# Patient Record
Sex: Male | Born: 1985 | Race: White | Hispanic: No | Marital: Married | Smoking: Never smoker
Health system: Southern US, Community
[De-identification: ages and names within clinical notes are randomized; demographics above are authoritative.]

## PROBLEM LIST (undated history)

## (undated) DIAGNOSIS — K5792 Diverticulitis of intestine, part unspecified, without perforation or abscess without bleeding: Secondary | ICD-10-CM

## (undated) DIAGNOSIS — T7840XA Allergy, unspecified, initial encounter: Secondary | ICD-10-CM

## (undated) HISTORY — DX: Allergy, unspecified, initial encounter: T78.40XA

## (undated) HISTORY — PX: HERNIA REPAIR: SHX51

## (undated) HISTORY — PX: SHOULDER FUSION SURGERY: SHX775

---
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
## 2009-10-03 ENCOUNTER — Ambulatory Visit (HOSPITAL_COMMUNITY): Admission: RE | Admit: 2009-10-03 | Discharge: 2009-10-03 | Payer: Self-pay | Admitting: Family Medicine

## 2010-12-02 IMAGING — CT CT HEAD WO/W CM
2 of 3 series · 17 of 30 positions shown, 20 images · IV contrast (agent unspecified)
Comparison: None.

CLINICAL DATA: Sudden onset right parietal headache 1 week ago with
nausea and vomiting.

CT HEAD WITHOUT AND WITH CONTRAST
TECHNIQUE: Contiguous axial images were obtained from the base of
the skull through the vertex without and with intravenous contrast.
Contrast: 100 ml Nmnipaque-ETT

[Series 2: brain · axial · 0.47mm/px · z∈[+195,+318]mm · 9 of 32 slices shown, 12 images]
[im 4/32  brain]
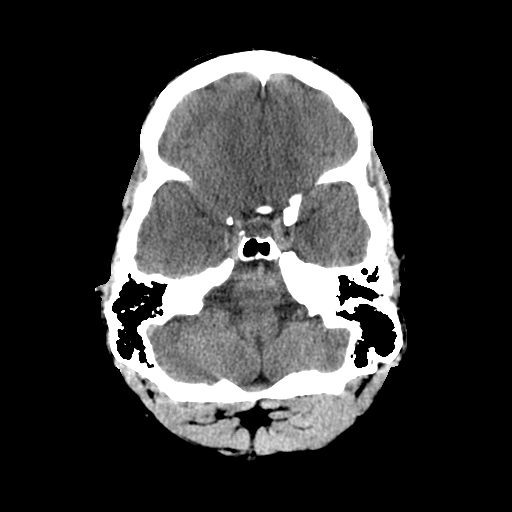
[im 4/32  bone]
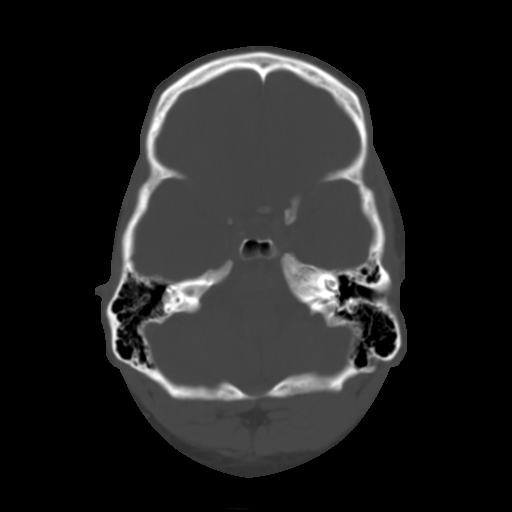
[im 7/32  brain]
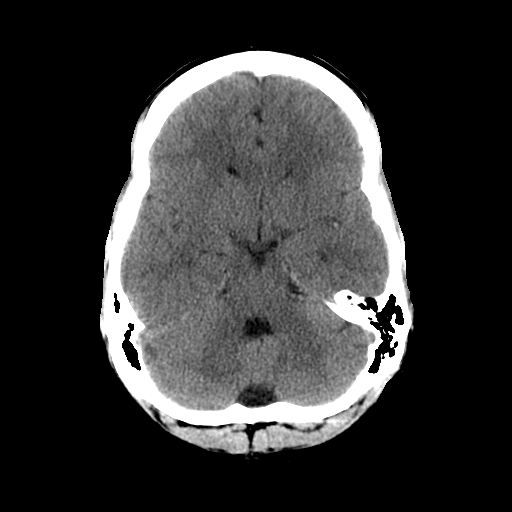
[im 10/32  brain]
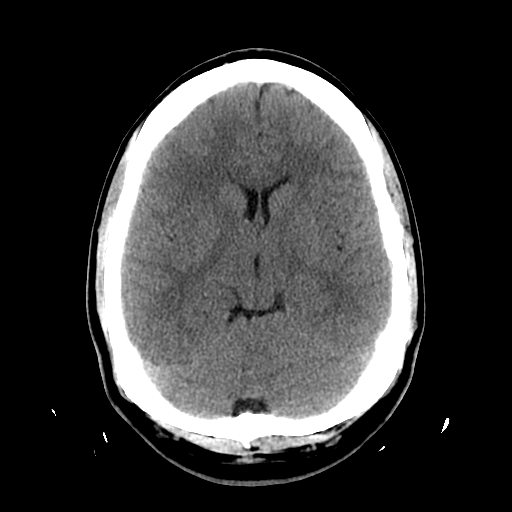
[im 13/32  brain]
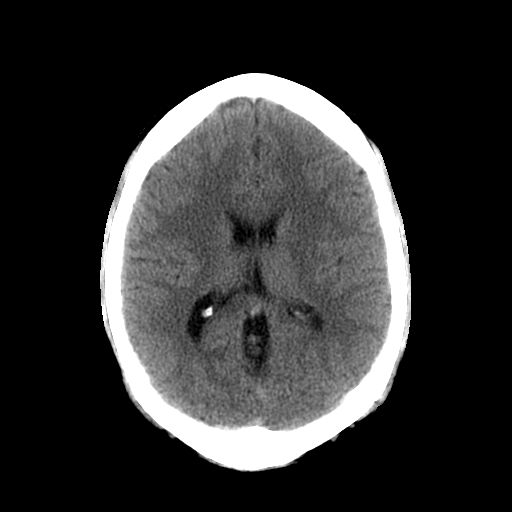
[im 16/32  brain]
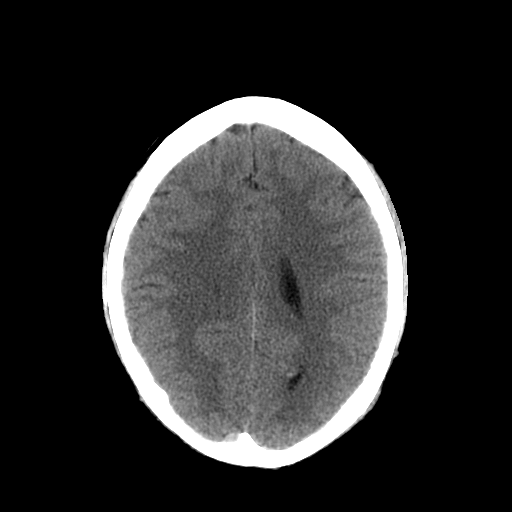
[im 16/32  bone]
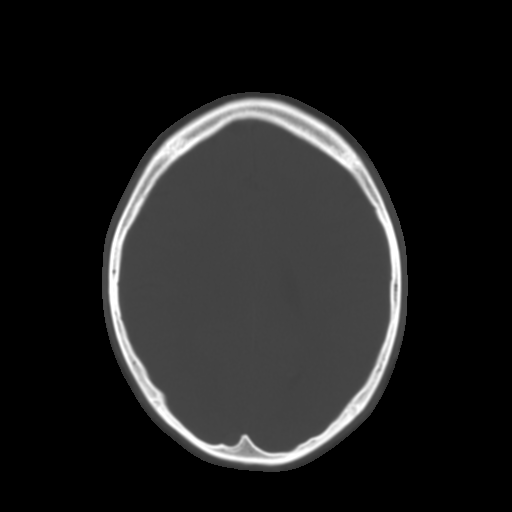
[im 19/32  brain]
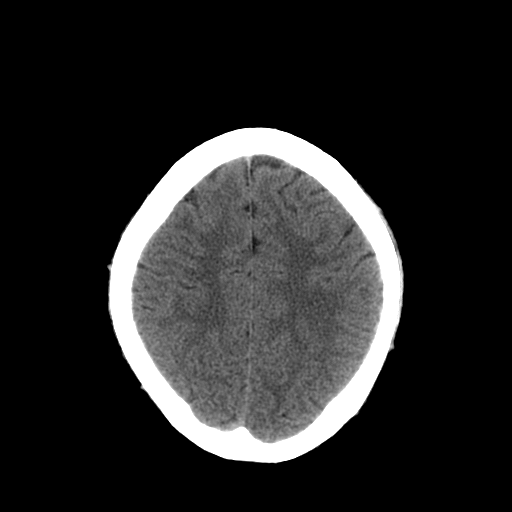
[im 22/32  brain]
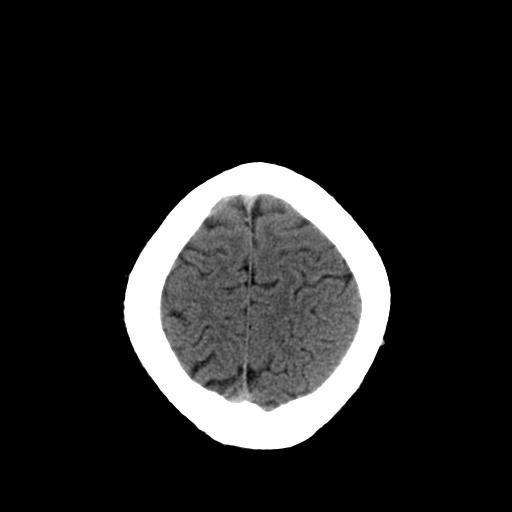
[im 25/32  brain]
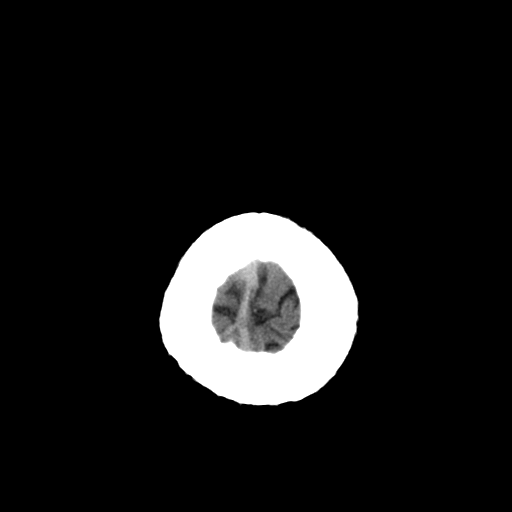
[im 28/32  brain]
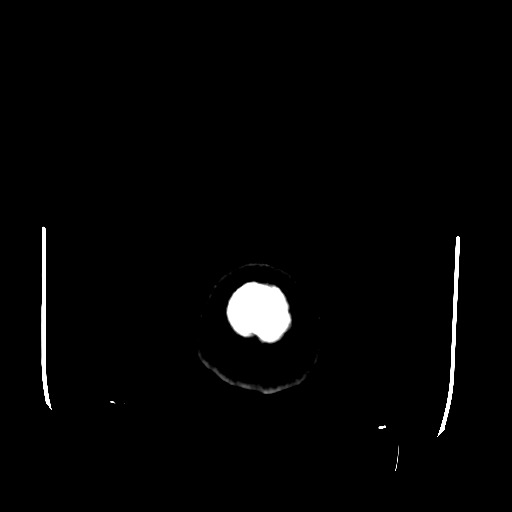
[im 28/32  bone]
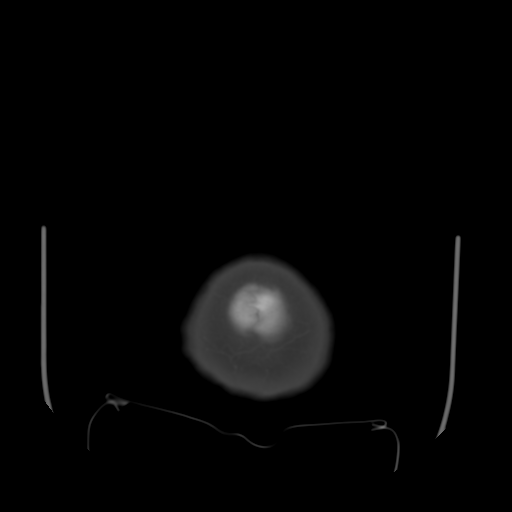

[Series 3: recon 2: brain · axial · 0.47mm/px · z∈[+195,+318]mm · 8 of 32 slices shown]
[im 4/32  brain]
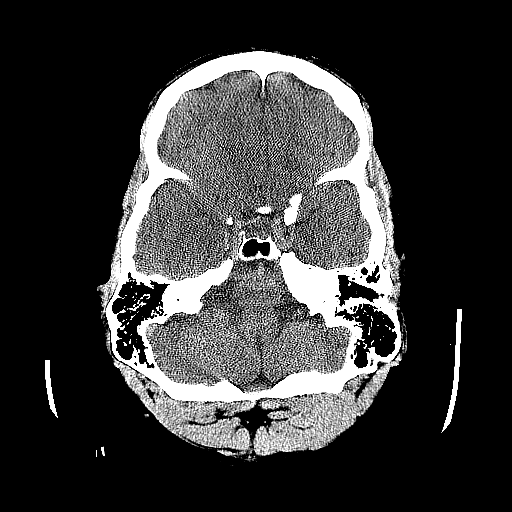
[im 7/32  brain]
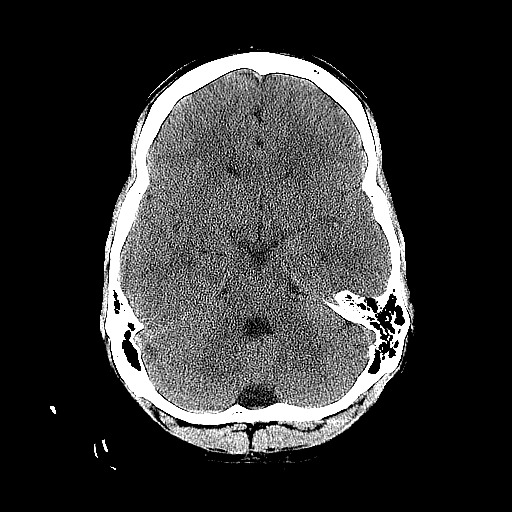
[im 11/32  brain]
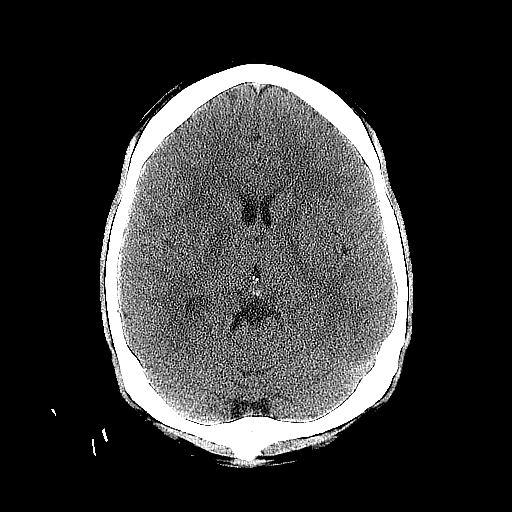
[im 14/32  brain]
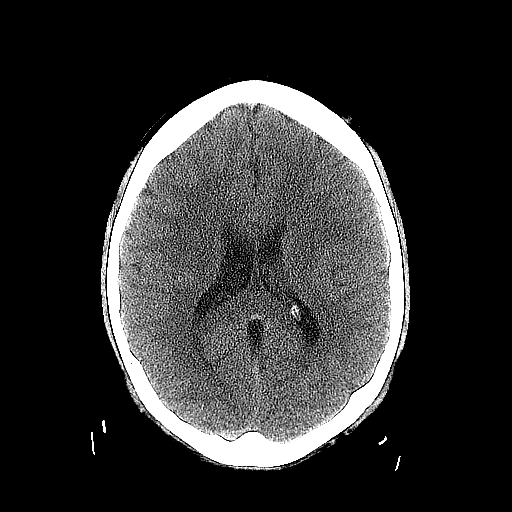
[im 18/32  brain]
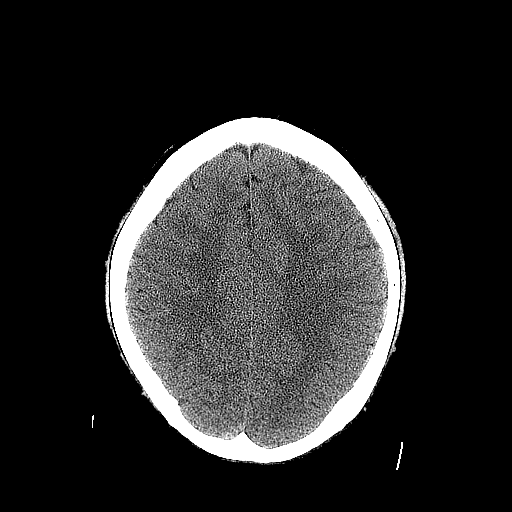
[im 21/32  brain]
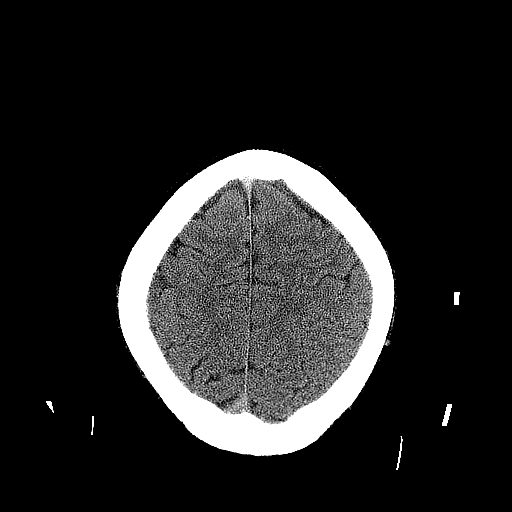
[im 25/32  brain]
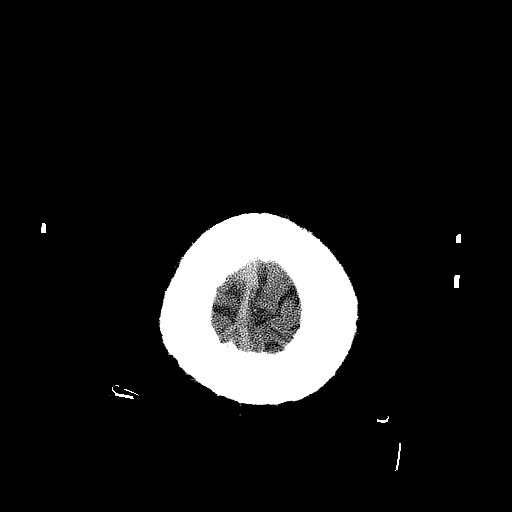
[im 28/32  brain]
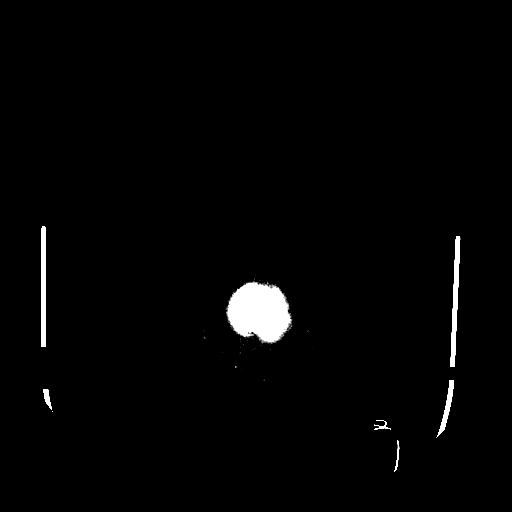

[17 of 30 positions shown; findings below may reference images not displayed]

FINDINGS: Normal appearing cerebral hemispheres and posterior fossa
structures.  Normal size and position of the ventricles.  No
intracranial hemorrhage, mass lesion or CT evidence of acute
infarction.  No visible aneurysm.  Unremarkable bones and included
paranasal sinuses.
IMPRESSION: Normal examination.

## 2013-02-10 ENCOUNTER — Emergency Department (HOSPITAL_COMMUNITY)
Admission: EM | Admit: 2013-02-10 | Discharge: 2013-02-10 | Disposition: A | Discharge status: Home / Self Care | Payer: Managed Care, Other (non HMO) | Attending: Emergency Medicine | Admitting: Emergency Medicine

## 2013-02-10 ENCOUNTER — Encounter (HOSPITAL_COMMUNITY): Payer: Self-pay

## 2013-02-10 DIAGNOSIS — R1031 Right lower quadrant pain: Secondary | ICD-10-CM | POA: Insufficient documentation

## 2013-02-10 DIAGNOSIS — R109 Unspecified abdominal pain: Secondary | ICD-10-CM

## 2013-02-10 DIAGNOSIS — Z8719 Personal history of other diseases of the digestive system: Secondary | ICD-10-CM | POA: Insufficient documentation

## 2013-02-10 HISTORY — DX: Diverticulitis of intestine, part unspecified, without perforation or abscess without bleeding: K57.92

## 2013-02-10 MED ORDER — CIPROFLOXACIN HCL 500 MG PO TABS: 500.0000 mg | ORAL_TABLET | Freq: Two times a day (BID) | ORAL | Status: DC

## 2013-02-10 MED ORDER — ONDANSETRON HCL 4 MG PO TABS: 4.0000 mg | ORAL_TABLET | Freq: Four times a day (QID) | ORAL | Status: DC

## 2013-02-10 MED ORDER — METRONIDAZOLE 500 MG PO TABS: 500.0000 mg | ORAL_TABLET | Freq: Two times a day (BID) | ORAL | Status: DC

## 2013-02-10 MED ORDER — CIPROFLOXACIN HCL 250 MG PO TABS
500.0000 mg | ORAL_TABLET | Freq: Once | ORAL | Status: AC
  Administered 2013-02-10: 500 mg via ORAL
  Filled 2013-02-10: qty 2

## 2013-02-10 MED ORDER — HYDROCODONE-ACETAMINOPHEN 5-325 MG PO TABS: 1.0000 | ORAL_TABLET | ORAL | Status: DC | PRN

## 2013-02-10 MED ORDER — ONDANSETRON 4 MG PO TBDP
4.0000 mg | ORAL_TABLET | Freq: Once | ORAL | Status: AC
  Administered 2013-02-10: 4 mg via ORAL
  Filled 2013-02-10: qty 1

## 2013-02-10 MED ORDER — HYDROCODONE-ACETAMINOPHEN 5-325 MG PO TABS
1.0000 | ORAL_TABLET | Freq: Once | ORAL | Status: AC
  Administered 2013-02-10: 1 via ORAL
  Filled 2013-02-10: qty 1

## 2013-02-10 MED ORDER — METRONIDAZOLE 500 MG PO TABS
500.0000 mg | ORAL_TABLET | Freq: Once | ORAL | Status: AC
  Administered 2013-02-10: 500 mg via ORAL
  Filled 2013-02-10: qty 1

## 2013-02-10 NOTE — ED Notes (Signed)
abd pain, history of diverticulitis

## 2013-02-10 NOTE — ED Provider Notes (Signed)
History     CSN: [id number]  Arrival date & time 02/10/13  4098   First MD Initiated Contact with Patient 02/10/13 [phone]      Chief Complaint  Patient presents with  . Abdominal Pain    (Consider location/radiation/quality/duration/timing/severity/associated sxs/prior treatment) HPI Alan [name] is a 27 y.o. male who presents to the Emergency Department complaining of right sided abdominal pain associated with cramping that began at 10 PM and is similar to the diverticulitis he had 5 years ago. Denies vomiting, diarrhea, fever, chills.  Past Medical History  Diagnosis Date  . Diverticulitis     History reviewed. No pertinent past surgical history.  History reviewed. No pertinent family history.  History  Substance Use Topics  . Smoking status: Never Smoker   . Smokeless tobacco: Not on file  . Alcohol Use: No      Review of Systems  Constitutional: Negative for fever.       10 Systems reviewed and are negative for acute change except as noted in the HPI.  HENT: Negative for congestion.   Eyes: Negative for discharge and redness.  Respiratory: Negative for cough and shortness of breath.   Cardiovascular: Negative for chest pain.  Gastrointestinal: Positive for abdominal pain. Negative for vomiting.  Musculoskeletal: Negative for back pain.  Skin: Negative for rash.  Neurological: Negative for syncope, numbness and headaches.  Psychiatric/Behavioral:       No behavior change.    Allergies  Review of patient's allergies indicates no known allergies.  Home Medications  No current outpatient prescriptions on file.  BP 132/92  Pulse 70  Temp(Src) 96.9 F (36.1 C) (Oral)  Resp 18  Ht 5\' 9"  (1.753 m)  Wt 195 lb (88.451 kg)  BMI 28.78 kg/m2  SpO2 99%  Physical Exam  Nursing note and vitals reviewed. Constitutional: He appears well-developed and well-nourished.  Awake, alert, nontoxic appearance.  HENT:  Head: Normocephalic and atraumatic.  Right Ear:  External ear normal.  Left Ear: External ear normal.  Mouth/Throat: Oropharynx is clear and moist.  Eyes: EOM are normal. Pupils are equal, round, and reactive to light. Right eye exhibits no discharge. Left eye exhibits no discharge.  Neck: Normal range of motion. Neck supple.  Cardiovascular: Normal rate and intact distal pulses.   Pulmonary/Chest: Effort normal and breath sounds normal. He exhibits no tenderness.  Abdominal: Soft. There is tenderness. There is no rebound.  Right sided abdominal pain without guarding or rebound.  Musculoskeletal: He exhibits no tenderness.  Baseline ROM, no obvious new focal weakness.  Neurological:  Mental status and motor strength appears baseline for patient and situation.  Skin: No rash noted.  Psychiatric: He has a normal mood and affect.    ED Course  Procedures (including critical care time)     MDM  Patient with right sided abdominal pain similar to diverticulitis he had 5 years ago. Given cipr, falgyl, hydrocodone, and zofran. Pt stable in ED with no significant deterioration in condition.The patient appears reasonably screened and/or stabilized for discharge and I doubt any other medical condition or other [facility] requiring further screening, evaluation, or treatment in the ED at this time prior to discharge.  MDM Reviewed: nursing note and vitals           Nicoletta Dress. Colon Branch, MD 02/10/13 [phone]

## 2013-11-16 HISTORY — PX: APPENDECTOMY: SHX54

## 2014-02-08 ENCOUNTER — Ambulatory Visit (INDEPENDENT_AMBULATORY_CARE_PROVIDER_SITE_OTHER): Payer: Managed Care, Other (non HMO) | Admitting: Gastroenterology

## 2014-02-08 ENCOUNTER — Other Ambulatory Visit: Payer: Self-pay | Admitting: Gastroenterology

## 2014-02-08 ENCOUNTER — Encounter (INDEPENDENT_AMBULATORY_CARE_PROVIDER_SITE_OTHER): Payer: Self-pay

## 2014-02-08 ENCOUNTER — Encounter: Payer: Self-pay | Admitting: Gastroenterology

## 2014-02-08 VITALS — BP 123/82 | HR 73 | Temp 98.0°F | Ht 70.0 in | Wt 195.4 lb

## 2014-02-08 DIAGNOSIS — K5732 Diverticulitis of large intestine without perforation or abscess without bleeding: Secondary | ICD-10-CM

## 2014-02-08 DIAGNOSIS — R109 Unspecified abdominal pain: Secondary | ICD-10-CM | POA: Insufficient documentation

## 2014-02-08 DIAGNOSIS — R194 Change in bowel habit: Secondary | ICD-10-CM

## 2014-02-08 MED ORDER — SOD PICOSULFATE-MAG OX-CIT ACD 10-3.5-12 MG-GM-GM PO PACK: 1.0000 | PACK | ORAL | Status: DC

## 2014-02-08 NOTE — Progress Notes (Addendum)
   Subjective:    Patient ID: Alan [name], male    DOB: 10/30/1986, 28 y.o.   MRN: [id number] No PCP Per Patient  HPI Pt reports 3 EPISODES OF DIVERTICULITIS. NEVER HAD A TCS. ONE EPISODE WAS HOSPITALIZED: 5 YEARS AGO. 2ND EPISODE SEEN IN ED-NO IMAGING AND GIVEN ABX AND SX RESOLVED: MAR 2015. LAST TIME HAD SIMILAR SX WAS 6 WEEKS AGO. SX: PAIN RIGHT SIDE, STOOL MORE HARD. NO NAUSEA/VOMITING, FEVER, OR CHILLS. BMs: 2-3X/DAY, MOSTLY #4. CAN BE A #3. RED MEAT CAUSE ABD PAIN AND BELLY PAIN. PT DENIES FEVER, CHILLS, BRBPR, nausea, vomiting, melena, diarrhea, constipation, roblems swallowing, problems with sedation, OR heartburn or indigestion.  Past Medical History  Diagnosis Date  . Diverticulitis    Past Surgical History  Procedure Laterality Date  . Shoulder fusion surgery      left  . Hernia repair     No Known Allergies  No current outpatient prescriptions on file.   No current facility-administered medications for this visit.   Family History  Problem Relation Age of Onset  . Colon polyps Neg Hx   . Colon cancer Neg Hx   . Rectal cancer Neg Hx   . Stomach cancer Neg Hx     History  Substance Use Topics  . Smoking status: Never Smoker   . Smokeless tobacco: Not on file  . Alcohol Use: No     Review of Systems PER HPI OTHERWISE ALL SYSTEMS ARE NEGATIVE.     Objective:   Physical Exam  Vitals reviewed. Constitutional: He is oriented to person, place, and time. He appears well-nourished. No distress.  HENT:  Head: Normocephalic and atraumatic.  Mouth/Throat: Oropharynx is clear and moist. No oropharyngeal exudate.  Eyes: Pupils are equal, round, and reactive to light. No scleral icterus.  Neck: Normal range of motion. Neck supple.  Cardiovascular: Normal rate, regular rhythm and normal heart sounds.   Pulmonary/Chest: Effort normal and breath sounds normal. No respiratory distress.  Abdominal: Soft. Bowel sounds are normal. He exhibits no distension. There is no  tenderness.  Musculoskeletal: Normal range of motion. He exhibits no edema.  Lymphadenopathy:    He has no cervical adenopathy.  Neurological: He is alert and oriented to person, place, and time.  NO FOCAL DEFICITS   Psychiatric: He has a normal mood and affect.          Assessment & Plan:

## 2014-02-08 NOTE — Assessment & Plan Note (Addendum)
At least 1 documented episode per pt.   CALLED TO REVIEW FILMS FROM [facility]. DR. Seever Papas NOT AVAILABLE.  TCS WITHIN NEXT 2-3 WEEKS.    ADDENDUM: I PERSONALLY REVIEWED CT ABD/PELVIS 2009 WITH DR. Janine Limbo NOT AVAILABLE. REPORT AVAILABLE: SIGMOID COLON DIVERTICULITIS-ONE DIVERTICULUM

## 2014-02-08 NOTE — Patient Instructions (Addendum)
FOLLOW A HIGH FIBER DIET. SEE INFO BELOW.  DRINK WATER TO KEEP YOUR URINE LIGHT YELLOW.  CALL DR. FIELDS IF YOU THINK YOU ARE HAVING AN EPISODE OF DIVERTICULITIS AND I WILL ORDER A CT SCAN..  FOLLOW UP IN 6 MOS.    High-Fiber Diet A high-fiber diet changes your normal diet to include more whole grains, legumes, fruits, and vegetables. Changes in the diet involve replacing refined carbohydrates with unrefined foods. The calorie level of the diet is essentially unchanged. The Dietary Reference Intake (recommended amount) for adult males is 38 grams per day. For adult females, it is 25 grams per day. Pregnant and lactating women should consume 28 grams of fiber per day. Fiber is the intact part of a plant that is not broken down during digestion. Functional fiber is fiber that has been isolated from the plant to provide a beneficial effect in the body. PURPOSE  Increase stool bulk.   Ease and regulate bowel movements.   Lower cholesterol.  INDICATIONS THAT YOU NEED MORE FIBER  Constipation and hemorrhoids.   Uncomplicated diverticulosis (intestine condition) and irritable bowel syndrome.   Weight management.   As a protective measure against hardening of the arteries (atherosclerosis), diabetes, and cancer.   GUIDELINES FOR INCREASING FIBER IN THE DIET  Start adding fiber to the diet slowly. A gradual increase of about 5 more grams (2 slices of whole-wheat bread, 2 servings of most fruits or vegetables, or 1 bowl of high-fiber cereal) per day is best. Too rapid an increase in fiber may result in constipation, flatulence, and bloating.   Drink enough water and fluids to keep your urine clear or pale yellow. Water, juice, or caffeine-free drinks are recommended. Not drinking enough fluid may cause constipation.   Eat a variety of high-fiber foods rather than one type of fiber.   Try to increase your intake of fiber through using high-fiber foods rather than fiber pills or supplements  that contain small amounts of fiber.   The goal is to change the types of food eaten. Do not supplement your present diet with high-fiber foods, but replace foods in your present diet.  INCLUDE A VARIETY OF FIBER SOURCES  Replace refined and processed grains with whole grains, canned fruits with fresh fruits, and incorporate other fiber sources. White rice, white breads, and most bakery goods contain little or no fiber.   Brown whole-grain rice, buckwheat oats, and many fruits and vegetables are all good sources of fiber. These include: broccoli, Brussels sprouts, cabbage, cauliflower, beets, sweet potatoes, white potatoes (skin on), carrots, tomatoes, eggplant, squash, berries, fresh fruits, and dried fruits.   Cereals appear to be the richest source of fiber. Cereal fiber is found in whole grains and bran. Bran is the fiber-rich outer coat of cereal grain, which is largely removed in refining. In whole-grain cereals, the bran remains. In breakfast cereals, the largest amount of fiber is found in those with "bran" in their names. The fiber content is sometimes indicated on the label.   You may need to include additional fruits and vegetables each day.   In baking, for 1 cup white flour, you may use the following substitutions:   1 cup whole-wheat flour minus 2 tablespoons.   1/2 cup white flour plus 1/2 cup whole-wheat flour.

## 2014-02-08 NOTE — Progress Notes (Signed)
Reminder in epic °

## 2014-02-09 ENCOUNTER — Encounter (HOSPITAL_COMMUNITY): Payer: Self-pay | Admitting: Pharmacy Technician

## 2014-02-12 NOTE — Progress Notes (Signed)
No pcp

## 2014-02-23 ENCOUNTER — Encounter (HOSPITAL_COMMUNITY): Payer: Self-pay | Admitting: *Deleted

## 2014-02-23 ENCOUNTER — Ambulatory Visit (HOSPITAL_COMMUNITY)
Admission: RE | Admit: 2014-02-23 | Discharge: 2014-02-23 | Disposition: A | Discharge status: Home / Self Care | Payer: Managed Care, Other (non HMO) | Source: Ambulatory Visit | Attending: Gastroenterology | Admitting: Gastroenterology

## 2014-02-23 ENCOUNTER — Encounter: Payer: Self-pay | Admitting: Gastroenterology

## 2014-02-23 ENCOUNTER — Encounter (HOSPITAL_COMMUNITY)
Admission: RE | Disposition: A | Discharge status: Home / Self Care | Payer: Self-pay | Source: Ambulatory Visit | Attending: Gastroenterology

## 2014-02-23 DIAGNOSIS — D126 Benign neoplasm of colon, unspecified: Secondary | ICD-10-CM

## 2014-02-23 DIAGNOSIS — R198 Other specified symptoms and signs involving the digestive system and abdomen: Secondary | ICD-10-CM

## 2014-02-23 DIAGNOSIS — R194 Change in bowel habit: Secondary | ICD-10-CM

## 2014-02-23 DIAGNOSIS — R109 Unspecified abdominal pain: Secondary | ICD-10-CM

## 2014-02-23 DIAGNOSIS — K5732 Diverticulitis of large intestine without perforation or abscess without bleeding: Secondary | ICD-10-CM

## 2014-02-23 DIAGNOSIS — K648 Other hemorrhoids: Secondary | ICD-10-CM | POA: Insufficient documentation

## 2014-02-23 HISTORY — PX: COLONOSCOPY: SHX5424

## 2014-02-23 SURGERY — COLONOSCOPY
Anesthesia: Moderate Sedation

## 2014-02-23 MED ORDER — MIDAZOLAM HCL 5 MG/5ML IJ SOLN
INTRAMUSCULAR | Status: DC | PRN
  Administered 2014-02-23 (×4): 2 mg via INTRAVENOUS

## 2014-02-23 MED ORDER — STERILE WATER FOR IRRIGATION IR SOLN
Status: DC | PRN
  Administered 2014-02-23: 11:00:00

## 2014-02-23 MED ORDER — SODIUM CHLORIDE 0.9 % IV SOLN
INTRAVENOUS | Status: DC
Start: 2014-02-23 — End: 2014-02-23
  Administered 2014-02-23: 1000 mL via INTRAVENOUS

## 2014-02-23 MED ORDER — MEPERIDINE HCL 100 MG/ML IJ SOLN
INTRAMUSCULAR | Status: AC
  Filled 2014-02-23: qty 2

## 2014-02-23 MED ORDER — MIDAZOLAM HCL 5 MG/5ML IJ SOLN
INTRAMUSCULAR | Status: AC
  Filled 2014-02-23: qty 10

## 2014-02-23 MED ORDER — MEPERIDINE HCL 100 MG/ML IJ SOLN
INTRAMUSCULAR | Status: DC | PRN
  Administered 2014-02-23 (×3): 25 mg via INTRAVENOUS
  Administered 2014-02-23: 50 mg via INTRAVENOUS

## 2014-02-23 NOTE — Discharge Instructions (Signed)
You had 1 polyp removed FROM YOUR colon. You have internal hemorrhoids. I BIOPSIED YOUR COLON. YOUR SMALL BOWEL IS NORMAL.   TAKE A PROBIOTIC DAILY FOR ONE MONTH ([employer], [place]). CONTINUE INDEFINITELY IF YOU FEEL IT'S MAKING A DIFFERENCE.  FOLLOW A HIGH FIBER DIET. AVOID ITEMS THAT CAUSE BLOATING & GAS. SEE INFO BELOW.  YOUR BIOPSY RESULTS SHOULD BE BACK IN 10 DAYS.     Colonoscopy Care After Read the instructions outlined below and refer to this sheet in the next week. These discharge instructions provide you with general information on caring for yourself after you leave the hospital. While your treatment has been planned according to the most current medical practices available, unavoidable complications occasionally occur. If you have any problems or questions after discharge, call DR. Layloni Fahrner, [phone].  ACTIVITY  You may resume your regular activity, but move at a slower pace for the next 24 hours.   Take frequent rest periods for the next 24 hours.   Walking will help get rid of the air and reduce the bloated feeling in your belly (abdomen).   No driving for 24 hours (because of the medicine (anesthesia) used during the test).   You may shower.   Do not sign any important legal documents or operate any machinery for 24 hours (because of the anesthesia used during the test).    NUTRITION  Drink plenty of fluids.   You may resume your normal diet as instructed by your doctor.   Begin with a light meal and progress to your normal diet. Heavy or fried foods are harder to digest and may make you feel sick to your stomach (nauseated).   Avoid alcoholic beverages for 24 hours or as instructed.    MEDICATIONS  You may resume your normal medications.   WHAT YOU CAN EXPECT TODAY  Some feelings of bloating in the abdomen.   Passage of more gas than usual.   Spotting of blood in your stool or on the toilet paper  .  IF YOU HAD  POLYPS REMOVED DURING THE COLONOSCOPY:  Eat a soft diet IF YOU HAVE NAUSEA, BLOATING, ABDOMINAL PAIN, OR VOMITING.    FINDING OUT THE RESULTS OF YOUR TEST Not all test results are available during your visit. DR. Oneida Alar WILL CALL YOU WITHIN 7 DAYS OF YOUR PROCEDUE WITH YOUR RESULTS. Do not assume everything is normal if you have not heard from DR. Khyrin Trevathan IN ONE WEEK, CALL HER OFFICE AT [phone].  SEEK IMMEDIATE MEDICAL ATTENTION AND CALL THE OFFICE: [phone] IF:  You have more than a spotting of blood in your stool.   Your belly is swollen (abdominal distention).   You are nauseated or vomiting.   You have a temperature over 101F.   You have abdominal pain or discomfort that is severe or gets worse throughout the day.   High-Fiber Diet A high-fiber diet changes your normal diet to include more whole grains, legumes, fruits, and vegetables. Changes in the diet involve replacing refined carbohydrates with unrefined foods. The calorie level of the diet is essentially unchanged. The Dietary Reference Intake (recommended amount) for adult males is 38 grams per day. For adult females, it is 25 grams per day. Pregnant and lactating women should consume 28 grams of fiber per day. Fiber is the intact part of a plant that is not broken down during digestion. Functional fiber is fiber that has been isolated from the plant to provide a beneficial effect in the body. PURPOSE  Increase stool bulk.   Ease and regulate bowel movements.   Lower cholesterol.  INDICATIONS THAT YOU NEED MORE FIBER  Constipation and hemorrhoids.   Uncomplicated diverticulosis (intestine condition) and irritable bowel syndrome.   Weight management.   As a protective measure against hardening of the arteries (atherosclerosis), diabetes, and cancer.   GUIDELINES FOR INCREASING FIBER IN THE DIET  Start adding fiber to the diet slowly. A gradual increase of about 5 more grams (2 slices of whole-wheat bread,  2 servings of most fruits or vegetables, or 1 bowl of high-fiber cereal) per day is best. Too rapid an increase in fiber may result in constipation, flatulence, and bloating.   Drink enough water and fluids to keep your urine clear or pale yellow. Water, juice, or caffeine-free drinks are recommended. Not drinking enough fluid may cause constipation.   Eat a variety of high-fiber foods rather than one type of fiber.   Try to increase your intake of fiber through using high-fiber foods rather than fiber pills or supplements that contain small amounts of fiber.   The goal is to change the types of food eaten. Do not supplement your present diet with high-fiber foods, but replace foods in your present diet.  INCLUDE A VARIETY OF FIBER SOURCES  Replace refined and processed grains with whole grains, canned fruits with fresh fruits, and incorporate other fiber sources. White rice, white breads, and most bakery goods contain little or no fiber.   Brown whole-grain rice, buckwheat oats, and many fruits and vegetables are all good sources of fiber. These include: broccoli, Brussels sprouts, cabbage, cauliflower, beets, sweet potatoes, white potatoes (skin on), carrots, tomatoes, eggplant, squash, berries, fresh fruits, and dried fruits.   Cereals appear to be the richest source of fiber. Cereal fiber is found in whole grains and bran. Bran is the fiber-rich outer coat of cereal grain, which is largely removed in refining. In whole-grain cereals, the bran remains. In breakfast cereals, the largest amount of fiber is found in those with "bran" in their names. The fiber content is sometimes indicated on the label.   You may need to include additional fruits and vegetables each day.   In baking, for 1 cup white flour, you may use the following substitutions:   1 cup whole-wheat flour minus 2 tablespoons.   1/2 cup white flour plus 1/2 cup whole-wheat flour.   Polyps, Colon  A polyp is extra tissue that  grows inside your body. Colon polyps grow in the large intestine. The large intestine, also called the colon, is part of your digestive system. It is a long, hollow tube at the end of your digestive tract where your body makes and stores stool. Most polyps are not dangerous. They are benign. This means they are not cancerous. But over time, some types of polyps can turn into cancer. Polyps that are smaller than a pea are usually not harmful. But larger polyps could someday become or may already be cancerous. To be safe, doctors remove all polyps and test them.   WHO GETS POLYPS? Anyone can get polyps, but certain people are more likely than others. You may have a greater chance of getting polyps if:  You are over 50.   You have had polyps before.   Someone in your family has had polyps.   Someone in your family has had cancer of the large intestine.   Find out if someone in your family has had polyps. You may also be more likely to  get polyps if you:   Eat a lot of fatty foods   Smoke   Drink alcohol   Do not exercise  Eat too much   TREATMENT  The caregiver will remove the polyp during sigmoidoscopy or colonoscopy.    PREVENTION There is not one sure way to prevent polyps. You might be able to lower your risk of getting them if you:  Eat more fruits and vegetables and less fatty food.   Do not smoke.   Avoid alcohol.   Exercise every day.   Lose weight if you are overweight.   Eating more calcium and folate can also lower your risk of getting polyps. Some foods that are rich in calcium are milk, cheese, and broccoli. Some foods that are rich in folate are chickpeas, kidney beans, and spinach.   Hemorrhoids Hemorrhoids are dilated (enlarged) veins around the rectum. Sometimes clots will form in the veins. This makes them swollen and painful. These are called thrombosed hemorrhoids. Causes of hemorrhoids include:  Constipation.   Straining to have a bowel movement.    HEAVY LIFTING HOME CARE INSTRUCTIONS  Eat a well balanced diet and drink 6 to 8 glasses of water every day to avoid constipation. You may also use a bulk laxative.   Avoid straining to have bowel movements.   Keep anal area dry and clean.   Do not use a donut shaped pillow or sit on the toilet for long periods. This increases blood pooling and pain.   Move your bowels when your body has the urge; this will require less straining and will decrease pain and pressure.

## 2014-02-23 NOTE — H&P (View-Only) (Signed)
   Subjective:    Patient ID: Alan [name], male    DOB: 06/14/1986, 27 y.o.   MRN: [id number] No PCP Per Patient  HPI Pt reports 3 EPISODES OF DIVERTICULITIS. NEVER HAD A TCS. ONE EPISODE WAS HOSPITALIZED: 5 YEARS AGO. 2ND EPISODE SEEN IN ED-NO IMAGING AND GIVEN ABX AND SX RESOLVED: MAR 2015. LAST TIME HAD SIMILAR SX WAS 6 WEEKS AGO. SX: PAIN RIGHT SIDE, STOOL MORE HARD. NO NAUSEA/VOMITING, FEVER, OR CHILLS. BMs: 2-3X/DAY, MOSTLY #4. CAN BE A #3. RED MEAT CAUSE ABD PAIN AND BELLY PAIN. PT DENIES FEVER, CHILLS, BRBPR, nausea, vomiting, melena, diarrhea, constipation, roblems swallowing, problems with sedation, OR heartburn or indigestion.  Past Medical History  Diagnosis Date  . Diverticulitis    Past Surgical History  Procedure Laterality Date  . Shoulder fusion surgery      left  . Hernia repair     No Known Allergies  No current outpatient prescriptions on file.   No current facility-administered medications for this visit.   Family History  Problem Relation Age of Onset  . Colon polyps Neg Hx   . Colon cancer Neg Hx   . Rectal cancer Neg Hx   . Stomach cancer Neg Hx     History  Substance Use Topics  . Smoking status: Never Smoker   . Smokeless tobacco: Not on file  . Alcohol Use: No     Review of Systems PER HPI OTHERWISE ALL SYSTEMS ARE NEGATIVE.     Objective:   Physical Exam  Vitals reviewed. Constitutional: He is oriented to person, place, and time. He appears well-nourished. No distress.  HENT:  Head: Normocephalic and atraumatic.  Mouth/Throat: Oropharynx is clear and moist. No oropharyngeal exudate.  Eyes: Pupils are equal, round, and reactive to light. No scleral icterus.  Neck: Normal range of motion. Neck supple.  Cardiovascular: Normal rate, regular rhythm and normal heart sounds.   Pulmonary/Chest: Effort normal and breath sounds normal. No respiratory distress.  Abdominal: Soft. Bowel sounds are normal. He exhibits no distension. There is no  tenderness.  Musculoskeletal: Normal range of motion. He exhibits no edema.  Lymphadenopathy:    He has no cervical adenopathy.  Neurological: He is alert and oriented to person, place, and time.  NO FOCAL DEFICITS   Psychiatric: He has a normal mood and affect.          Assessment & Plan:   

## 2014-02-23 NOTE — Interval H&P Note (Signed)
History and Physical Interval Note:  02/23/2014 10:41 AM  Alan [name]  has presented today for surgery, with the diagnosis of RECURRENT DIVERTICULITIS  AND CHANGE IN BOWEL HABITS  The various methods of treatment have been discussed with the patient and family. After consideration of risks, benefits and other options for treatment, the patient has consented to  Procedure(s) with comments: COLONOSCOPY (N/A) - 10:45 as a surgical intervention .  The patient's history has been reviewed, patient examined, no change in status, stable for surgery.  I have reviewed the patient's chart and labs.  Questions were answered to the patient's satisfaction.     Ranulfo Kall L Notnamed Croucher

## 2014-02-23 NOTE — Op Note (Signed)
[facility] [address] [place], [address]   COLONOSCOPY PROCEDURE REPORT  PATIENT: Alan, [name]  MR#: [id number] BIRTHDATE: Aug 09, 1986 , 27  yrs. old GENDER: Male ENDOSCOPIST: Barney Drain, MD REFERRED BY: PROCEDURE DATE:  02/23/2014 PROCEDURE:   Colonoscopy with biopsy and with cold biopsy polypectomy INDICATIONS:Change in bowel habits. MEDICATIONS: Demerol 125 mg IV and Versed 8 mg IV  DESCRIPTION OF PROCEDURE:    Physical exam was performed.  Informed consent was obtained from the patient after explaining the benefits, risks, and alternatives to procedure.  The patient was connected to monitor and placed in left lateral position. Continuous oxygen was provided by nasal cannula and IV medicine administered through an indwelling cannula.  After administration of sedation and rectal exam, the patients rectum was intubated and the EC-[id number] ([id number])  colonoscope was advanced under direct visualization to the ileum.  The scope was removed slowly by carefully examining the color, texture, anatomy, and integrity mucosa on the way out.  The patient was recovered in endoscopy and discharged home in satisfactory condition.    COLON FINDINGS: The mucosa appeared normal in the terminal ileum.  , A sessile polyp measuring 3 mm in size was found in the descending colon.  A polypectomy was performed with cold forceps.  , The colon mucosa was otherwise normal. COLD FORCEPS BIOPSIES OBTAINED TO EVALUATE FOR MICROSCOPIC OLITIS. UNABLE TO APPRECIATE A DIVERTICULUM IN THE SIGMOID COLON. Small internal hemorrhoids were found.  PREP QUALITY: good. CECAL W/D TIME: 18 minutes     COMPLICATIONS: None  ENDOSCOPIC IMPRESSION: 1.   Normal mucosa in the terminal ileum 2.   ONE COLON POLYP REMOVED 3.   Small internal hemorrhoids 4.  NO SOURCE FOR RLQ PAIN IDDENTIFIED. SX MOST LIKELY DUE TO MILD IBS OR LESS LIKELY THE GU TRACT OR OCCULT  HERNIA.   RECOMMENDATIONS: AWAIT BIOPSY HIGH FIBER DIET       _______________________________ Lorrin MaisBarney Drain, MD 02/23/2014 11:29 AM

## 2014-02-27 ENCOUNTER — Encounter (HOSPITAL_COMMUNITY): Payer: Self-pay | Admitting: Gastroenterology

## 2014-03-13 ENCOUNTER — Telehealth: Payer: Self-pay | Admitting: Gastroenterology

## 2014-03-13 NOTE — Telephone Encounter (Signed)
Please call pt. 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 HIS colon biopsies are normal. NO SOURCE FOR HIS RLQ PAIN HAS BEEN IDENTIFIED.    HE NEEDS A CT ABD/PELVIS W/ IV AND ORAL CONTRAST, DX: RLQ ABD PAIN. HE SHOULD LET us KNOW OF HE NEEDS PRE-MEDS FOR IV DYE.    TAKE A PROBIOTIC DAILY.   FOLLOW A HIGH FIBER DIET. AVOID ITEMS THAT CAUSE BLOATING & GAS.   NEXT TCS 2025.

## 2014-03-14 ENCOUNTER — Other Ambulatory Visit: Payer: Self-pay | Admitting: Gastroenterology

## 2014-03-14 DIAGNOSIS — R1031 Right lower quadrant pain: Secondary | ICD-10-CM

## 2014-03-14 NOTE — Telephone Encounter (Signed)
Reminder in EPIC 

## 2014-03-14 NOTE — Telephone Encounter (Signed)
NOTED

## 2014-03-14 NOTE — Telephone Encounter (Signed)
Pt called and was informed of the results. I gave him the info for the CT and he said he cannot do it on that short of a notice. He will call back when he is ready to schedule.

## 2014-03-14 NOTE — Telephone Encounter (Signed)
CT abd/pel w/IV & oral cm is scheduled for Friday May 1st at 9:30, patient needs to arrive at 9:15 and he needs to go by Radiology before Friday to pick up oral contrast.

## 2014-03-16 ENCOUNTER — Other Ambulatory Visit (HOSPITAL_COMMUNITY): Payer: Managed Care, Other (non HMO)

## 2014-03-19 NOTE — Telephone Encounter (Signed)
REVIEWED.  

## 2014-05-26 ENCOUNTER — Encounter (HOSPITAL_COMMUNITY): Payer: Managed Care, Other (non HMO) | Admitting: Anesthesiology

## 2014-05-26 ENCOUNTER — Encounter (HOSPITAL_COMMUNITY): Admission: EM | Disposition: A | Discharge status: Home / Self Care | Payer: Self-pay | Source: Home / Self Care

## 2014-05-26 ENCOUNTER — Emergency Department (HOSPITAL_COMMUNITY): Payer: Managed Care, Other (non HMO)

## 2014-05-26 ENCOUNTER — Encounter (HOSPITAL_COMMUNITY): Payer: Self-pay | Admitting: Emergency Medicine

## 2014-05-26 ENCOUNTER — Emergency Department (HOSPITAL_COMMUNITY): Payer: Managed Care, Other (non HMO) | Admitting: Anesthesiology

## 2014-05-26 ENCOUNTER — Emergency Department (HOSPITAL_COMMUNITY)
Admission: EM | Admit: 2014-05-26 | Discharge: 2014-05-26 | Disposition: A | Discharge status: Home / Self Care | Payer: Managed Care, Other (non HMO) | Attending: General Surgery | Admitting: General Surgery

## 2014-05-26 DIAGNOSIS — Z881 Allergy status to other antibiotic agents status: Secondary | ICD-10-CM | POA: Insufficient documentation

## 2014-05-26 DIAGNOSIS — Z91013 Allergy to seafood: Secondary | ICD-10-CM | POA: Insufficient documentation

## 2014-05-26 DIAGNOSIS — K3580 Unspecified acute appendicitis: Secondary | ICD-10-CM | POA: Insufficient documentation

## 2014-05-26 HISTORY — PX: LAPAROSCOPIC APPENDECTOMY: SHX408

## 2014-05-26 LAB — CBC WITH DIFFERENTIAL/PLATELET
Basophils Absolute: 0 10*3/uL (ref 0.0–0.1)
Basophils Relative: 0 % (ref 0–1)
EOS PCT: 0 % (ref 0–5)
Eosinophils Absolute: 0 10*3/uL (ref 0.0–0.7)
HCT: 47.4 % (ref 39.0–52.0)
Hemoglobin: 16.8 g/dL (ref 13.0–17.0)
LYMPHS ABS: 1.7 10*3/uL (ref 0.7–4.0)
LYMPHS PCT: 12 % (ref 12–46)
MCH: 31.1 pg (ref 26.0–34.0)
MCHC: 35.4 g/dL (ref 30.0–36.0)
MCV: 87.6 fL (ref 78.0–100.0)
Monocytes Absolute: 1.1 10*3/uL — ABNORMAL HIGH (ref 0.1–1.0)
Monocytes Relative: 8 % (ref 3–12)
NEUTROS PCT: 80 % — AB (ref 43–77)
Neutro Abs: 12 10*3/uL — ABNORMAL HIGH (ref 1.7–7.7)
PLATELETS: 170 10*3/uL (ref 150–400)
RBC: 5.41 MIL/uL (ref 4.22–5.81)
RDW: 12.8 % (ref 11.5–15.5)
WBC: 14.9 10*3/uL — AB (ref 4.0–10.5)

## 2014-05-26 LAB — URINALYSIS, ROUTINE W REFLEX MICROSCOPIC
Bilirubin Urine: NEGATIVE
Glucose, UA: NEGATIVE mg/dL
HGB URINE DIPSTICK: NEGATIVE
Ketones, ur: NEGATIVE mg/dL
LEUKOCYTES UA: NEGATIVE
Nitrite: NEGATIVE
Protein, ur: NEGATIVE mg/dL
SPECIFIC GRAVITY, URINE: 1.015 (ref 1.005–1.030)
UROBILINOGEN UA: 0.2 mg/dL (ref 0.0–1.0)
pH: 6.5 (ref 5.0–8.0)

## 2014-05-26 LAB — COMPREHENSIVE METABOLIC PANEL
ALBUMIN: 4.2 g/dL (ref 3.5–5.2)
ALK PHOS: 46 U/L (ref 39–117)
ALT: 41 U/L (ref 0–53)
ANION GAP: 13 (ref 5–15)
AST: 21 U/L (ref 0–37)
BUN: 14 mg/dL (ref 6–23)
CO2: 25 mEq/L (ref 19–32)
Calcium: 9.5 mg/dL (ref 8.4–10.5)
Chloride: 101 mEq/L (ref 96–112)
Creatinine, Ser: 0.88 mg/dL (ref 0.50–1.35)
GFR calc Af Amer: >90 mL/min (ref >90)
GFR calc non Af Amer: >90 mL/min (ref >90)
Glucose, Bld: 97 mg/dL (ref 70–99)
POTASSIUM: 3.8 meq/L (ref 3.7–5.3)
SODIUM: 139 meq/L (ref 137–147)
Total Bilirubin: 0.7 mg/dL (ref 0.3–1.2)
Total Protein: 7.9 g/dL (ref 6.0–8.3)

## 2014-05-26 LAB — LIPASE, BLOOD: Lipase: 24 U/L (ref 11–59)

## 2014-05-26 SURGERY — APPENDECTOMY, LAPAROSCOPIC
Anesthesia: General | Site: Abdomen

## 2014-05-26 MED ORDER — KETOROLAC TROMETHAMINE 30 MG/ML IJ SOLN
INTRAMUSCULAR | Status: AC
  Filled 2014-05-26: qty 1

## 2014-05-26 MED ORDER — OXYCODONE-ACETAMINOPHEN 7.5-325 MG PO TABS
1.0000 | ORAL_TABLET | ORAL | Status: DC | PRN
Start: 2014-05-26 — End: 2018-02-16

## 2014-05-26 MED ORDER — OXYCODONE-ACETAMINOPHEN 5-325 MG PO TABS
ORAL_TABLET | ORAL | Status: AC
  Filled 2014-05-26: qty 2

## 2014-05-26 MED ORDER — BACITRACIN 500 UNIT/GM EX OINT
TOPICAL_OINTMENT | CUTANEOUS | Status: DC | PRN
  Administered 2014-05-26: 1 via TOPICAL

## 2014-05-26 MED ORDER — SODIUM CHLORIDE 0.9 % IV BOLUS (SEPSIS)
1000.0000 mL | Freq: Once | INTRAVENOUS | Status: AC
  Administered 2014-05-26: 1000 mL via INTRAVENOUS

## 2014-05-26 MED ORDER — NEOSTIGMINE METHYLSULFATE 10 MG/10ML IV SOLN
INTRAVENOUS | Status: AC
  Filled 2014-05-26: qty 1

## 2014-05-26 MED ORDER — LACTATED RINGERS IV SOLN
INTRAVENOUS | Status: DC | PRN
  Administered 2014-05-26: 16:00:00 via INTRAVENOUS

## 2014-05-26 MED ORDER — BUPIVACAINE HCL (PF) 0.5 % IJ SOLN
INTRAMUSCULAR | Status: DC | PRN
  Administered 2014-05-26: 10 mL

## 2014-05-26 MED ORDER — MORPHINE SULFATE 4 MG/ML IJ SOLN
INTRAMUSCULAR | Status: AC
  Administered 2014-05-26: 4 mg via INTRAVENOUS
  Filled 2014-05-26: qty 1

## 2014-05-26 MED ORDER — MORPHINE SULFATE 4 MG/ML IJ SOLN
4.0000 mg | Freq: Once | INTRAMUSCULAR | Status: AC
  Administered 2014-05-26: 4 mg via INTRAVENOUS
  Filled 2014-05-26: qty 1

## 2014-05-26 MED ORDER — GLYCOPYRROLATE 0.2 MG/ML IJ SOLN
INTRAMUSCULAR | Status: AC
  Filled 2014-05-26: qty 2

## 2014-05-26 MED ORDER — PROPOFOL 10 MG/ML IV BOLUS
INTRAVENOUS | Status: DC | PRN
  Administered 2014-05-26: 200 mg via INTRAVENOUS

## 2014-05-26 MED ORDER — MIDAZOLAM HCL 2 MG/2ML IJ SOLN
INTRAMUSCULAR | Status: DC | PRN
  Administered 2014-05-26: 2 mg via INTRAVENOUS

## 2014-05-26 MED ORDER — BUPIVACAINE HCL (PF) 0.5 % IJ SOLN
INTRAMUSCULAR | Status: AC
  Filled 2014-05-26: qty 30

## 2014-05-26 MED ORDER — POVIDONE-IODINE 10 % EX OINT
TOPICAL_OINTMENT | CUTANEOUS | Status: AC
  Filled 2014-05-26: qty 1

## 2014-05-26 MED ORDER — MORPHINE SULFATE 4 MG/ML IJ SOLN
4.0000 mg | Freq: Once | INTRAMUSCULAR | Status: AC
  Administered 2014-05-26: 4 mg via INTRAVENOUS

## 2014-05-26 MED ORDER — ONDANSETRON HCL 4 MG/2ML IJ SOLN
4.0000 mg | Freq: Once | INTRAMUSCULAR | Status: DC
  Filled 2014-05-26: qty 2

## 2014-05-26 MED ORDER — PROPOFOL 10 MG/ML IV EMUL
INTRAVENOUS | Status: AC
  Filled 2014-05-26: qty 20

## 2014-05-26 MED ORDER — FENTANYL CITRATE 0.05 MG/ML IJ SOLN
INTRAMUSCULAR | Status: AC
  Filled 2014-05-26: qty 5

## 2014-05-26 MED ORDER — BACITRACIN ZINC 500 UNIT/GM EX OINT
TOPICAL_OINTMENT | CUTANEOUS | Status: AC
  Filled 2014-05-26: qty 0.9

## 2014-05-26 MED ORDER — PIPERACILLIN-TAZOBACTAM 3.375 G IVPB 30 MIN
3.3750 g | Freq: Once | INTRAVENOUS | Status: AC
  Administered 2014-05-26: 3.375 g via INTRAVENOUS
  Filled 2014-05-26: qty 50

## 2014-05-26 MED ORDER — ROCURONIUM BROMIDE 50 MG/5ML IV SOLN
INTRAVENOUS | Status: AC
  Filled 2014-05-26: qty 1

## 2014-05-26 MED ORDER — IOHEXOL 300 MG/ML  SOLN
50.0000 mL | Freq: Once | INTRAMUSCULAR | Status: AC | PRN
  Administered 2014-05-26: 50 mL via ORAL

## 2014-05-26 MED ORDER — SUCCINYLCHOLINE CHLORIDE 20 MG/ML IJ SOLN
INTRAMUSCULAR | Status: AC
  Filled 2014-05-26: qty 1

## 2014-05-26 MED ORDER — MIDAZOLAM HCL 2 MG/2ML IJ SOLN
INTRAMUSCULAR | Status: AC
  Filled 2014-05-26: qty 2

## 2014-05-26 MED ORDER — ONDANSETRON HCL 4 MG/2ML IJ SOLN
INTRAMUSCULAR | Status: DC | PRN
  Administered 2014-05-26: 4 mg via INTRAVENOUS

## 2014-05-26 MED ORDER — GLYCOPYRROLATE 0.2 MG/ML IJ SOLN
INTRAMUSCULAR | Status: AC
  Filled 2014-05-26: qty 1

## 2014-05-26 MED ORDER — ONDANSETRON HCL 4 MG/2ML IJ SOLN
4.0000 mg | Freq: Once | INTRAMUSCULAR | Status: AC
  Administered 2014-05-26: 4 mg via INTRAVENOUS

## 2014-05-26 MED ORDER — SUCCINYLCHOLINE CHLORIDE 20 MG/ML IJ SOLN
INTRAMUSCULAR | Status: DC | PRN
  Administered 2014-05-26: 120 mg via INTRAVENOUS

## 2014-05-26 MED ORDER — LIDOCAINE HCL (PF) 1 % IJ SOLN
INTRAMUSCULAR | Status: AC
  Filled 2014-05-26: qty 5

## 2014-05-26 MED ORDER — NEOSTIGMINE METHYLSULFATE 10 MG/10ML IV SOLN
INTRAVENOUS | Status: DC | PRN
  Administered 2014-05-26: 1 mg via INTRAVENOUS
  Administered 2014-05-26: 3 mg via INTRAVENOUS

## 2014-05-26 MED ORDER — KETOROLAC TROMETHAMINE 30 MG/ML IJ SOLN
30.0000 mg | Freq: Once | INTRAMUSCULAR | Status: AC
  Administered 2014-05-26: 30 mg via INTRAVENOUS

## 2014-05-26 MED ORDER — OXYCODONE-ACETAMINOPHEN 5-325 MG PO TABS
1.0000 | ORAL_TABLET | Freq: Once | ORAL | Status: AC
  Administered 2014-05-26: 1 via ORAL

## 2014-05-26 MED ORDER — IOHEXOL 300 MG/ML  SOLN
100.0000 mL | Freq: Once | INTRAMUSCULAR | Status: AC | PRN
  Administered 2014-05-26: 100 mL via INTRAVENOUS

## 2014-05-26 MED ORDER — LACTATED RINGERS IV SOLN: INTRAVENOUS | Status: DC | PRN

## 2014-05-26 MED ORDER — FENTANYL CITRATE 0.05 MG/ML IJ SOLN
INTRAMUSCULAR | Status: DC | PRN
  Administered 2014-05-26 (×7): 50 ug via INTRAVENOUS

## 2014-05-26 MED ORDER — LIDOCAINE HCL (CARDIAC) 10 MG/ML IV SOLN
INTRAVENOUS | Status: DC | PRN
  Administered 2014-05-26: 20 mg via INTRAVENOUS

## 2014-05-26 MED ORDER — ONDANSETRON HCL 4 MG/2ML IJ SOLN
INTRAMUSCULAR | Status: AC
  Filled 2014-05-26: qty 2

## 2014-05-26 MED ORDER — FENTANYL CITRATE 0.05 MG/ML IJ SOLN
INTRAMUSCULAR | Status: AC
  Filled 2014-05-26: qty 2

## 2014-05-26 MED ORDER — ROCURONIUM BROMIDE 100 MG/10ML IV SOLN
INTRAVENOUS | Status: DC | PRN
  Administered 2014-05-26: 5 mg via INTRAVENOUS
  Administered 2014-05-26: 25 mg via INTRAVENOUS
  Administered 2014-05-26: 5 mg via INTRAVENOUS

## 2014-05-26 MED ORDER — GLYCOPYRROLATE 0.2 MG/ML IJ SOLN
INTRAMUSCULAR | Status: DC | PRN
  Administered 2014-05-26: 0.2 mg via INTRAVENOUS
  Administered 2014-05-26: 0.6 mg via INTRAVENOUS

## 2014-05-26 MED ORDER — OXYCODONE-ACETAMINOPHEN 5-325 MG PO TABS
ORAL_TABLET | ORAL | Status: AC
  Filled 2014-05-26: qty 1

## 2014-05-26 MED ORDER — SODIUM CHLORIDE 0.9 % IR SOLN
Status: DC | PRN
  Administered 2014-05-26: 1000 mL

## 2014-05-26 SURGICAL SUPPLY — 44 items
BAG HAMPER (MISCELLANEOUS) ×3 IMPLANT
CHLORAPREP W/TINT 26ML (MISCELLANEOUS) ×3 IMPLANT
CLOTH BEACON ORANGE TIMEOUT ST (SAFETY) ×3 IMPLANT
COVER LIGHT HANDLE STERIS (MISCELLANEOUS) ×6 IMPLANT
CUTTER FLEX LINEAR 45M (STAPLE) IMPLANT
CUTTER LINEAR ENDO 35 ETS (STAPLE) IMPLANT
CUTTER LINEAR ENDO 35 ETS TH (STAPLE) ×3 IMPLANT
DECANTER SPIKE VIAL GLASS SM (MISCELLANEOUS) ×3 IMPLANT
DISSECTOR BLUNT TIP ENDO 5MM (MISCELLANEOUS) IMPLANT
DURAPREP 26ML APPLICATOR (WOUND CARE) IMPLANT
ELECT REM PT RETURN 9FT ADLT (ELECTROSURGICAL) ×3
ELECTRODE REM PT RTRN 9FT ADLT (ELECTROSURGICAL) ×1 IMPLANT
FILTER SMOKE EVAC LAPAROSHD (FILTER) ×3 IMPLANT
FORMALIN 10 PREFIL 120ML (MISCELLANEOUS) ×3 IMPLANT
GLOVE SURG SS PI 7.5 STRL IVOR (GLOVE) ×6 IMPLANT
GOWN STRL REUS W/TWL LRG LVL3 (GOWN DISPOSABLE) ×6 IMPLANT
INST SET LAPROSCOPIC AP (KITS) ×3 IMPLANT
IV NS IRRIG 3000ML ARTHROMATIC (IV SOLUTION) IMPLANT
KIT ROOM TURNOVER APOR (KITS) ×3 IMPLANT
MANIFOLD NEPTUNE II (INSTRUMENTS) ×3 IMPLANT
NEEDLE INSUFFLATION 14GA 120MM (NEEDLE) ×3 IMPLANT
NS IRRIG 1000ML POUR BTL (IV SOLUTION) ×3 IMPLANT
PACK LAP CHOLE LZT030E (CUSTOM PROCEDURE TRAY) ×3 IMPLANT
PAD ARMBOARD 7.5X6 YLW CONV (MISCELLANEOUS) ×3 IMPLANT
PENCIL HANDSWITCHING (ELECTRODE) ×3 IMPLANT
POUCH SPECIMEN RETRIEVAL 10MM (ENDOMECHANICALS) ×3 IMPLANT
RELOAD /EVU35 (ENDOMECHANICALS) IMPLANT
RELOAD 45 VASCULAR/THIN (ENDOMECHANICALS) IMPLANT
RELOAD CUTTER ETS 35MM STAND (ENDOMECHANICALS) IMPLANT
SCALPEL HARMONIC ACE (MISCELLANEOUS) ×3 IMPLANT
SET BASIN LINEN APH (SET/KITS/TRAYS/PACK) ×3 IMPLANT
SET TUBE IRRIG SUCTION NO TIP (IRRIGATION / IRRIGATOR) IMPLANT
SPONGE GAUZE 2X2 8PLY STER LF (GAUZE/BANDAGES/DRESSINGS) ×3
SPONGE GAUZE 2X2 8PLY STRL LF (GAUZE/BANDAGES/DRESSINGS) ×6 IMPLANT
STAPLER VISISTAT (STAPLE) ×3 IMPLANT
SUT VICRYL 0 UR6 27IN ABS (SUTURE) ×3 IMPLANT
TAPE CLOTH SURG 4X10 WHT LF (GAUZE/BANDAGES/DRESSINGS) ×3 IMPLANT
TRAY FOLEY CATH 16FR SILVER (SET/KITS/TRAYS/PACK) ×3 IMPLANT
TROCAR ENDO BLADELESS 11MM (ENDOMECHANICALS) ×3 IMPLANT
TROCAR ENDO BLADELESS 12MM (ENDOMECHANICALS) ×3 IMPLANT
TROCAR XCEL NON-BLD 5MMX100MML (ENDOMECHANICALS) ×3 IMPLANT
TUBING INSUFFLATION (TUBING) ×3 IMPLANT
WARMER LAPAROSCOPE (MISCELLANEOUS) ×3 IMPLANT
YANKAUER SUCT 12FT TUBE ARGYLE (SUCTIONS) ×3 IMPLANT

## 2014-05-26 NOTE — H&P (Signed)
Alan [name] is an 28 y.o. male.   Chief Complaint: Right lower quadrant abdominal pain HPI: Patient is a 28 year old white male who presents with a less than 24-hour history of worsening right lower quadrant abdominal pain. CT scan the abdomen reveals acute appendicitis. No definitive perforation is appreciated, though the appendix was significantly inflamed.  Past Medical History  Diagnosis Date  . Diverticulitis 2009 [place]    CT POS [address] INFLAMMATION    Past Surgical History  Procedure Laterality Date  . Shoulder fusion surgery      left  . Hernia repair    . Colonoscopy N/A 02/23/2014    Procedure: COLONOSCOPY;  Surgeon: Danie Binder, MD;  [facility]: [facility];  Service: Endoscopy;  Laterality: N/A;  10:45    Family History  Problem Relation Age of Onset  . Colon polyps Neg Hx   . Colon cancer Neg Hx   . Rectal cancer Neg Hx   . Stomach cancer Neg Hx   . Epilepsy Mother   . Hypertension Father    Social History:  reports that he has never smoked. He does not have any smokeless tobacco history on file. He reports that he drinks alcohol. He reports that he does not use illicit drugs.  Allergies:  Allergies  Allergen Reactions  . Shellfish Allergy Hives and Nausea And Vomiting  . Sulfa Antibiotics Other (See Comments)    Unknown      (Not in a hospital admission)  Results for orders placed during the hospital encounter of 05/26/14 (from the past 48 hour(s))  CBC WITH DIFFERENTIAL     Status: Abnormal   Collection Time    05/26/14 11:50 AM      Result Value Ref Range   WBC 14.9 (*) 4.0 - 10.5 K/uL   RBC 5.41  4.22 - 5.81 MIL/uL   Hemoglobin 16.8  13.0 - 17.0 g/dL   HCT 47.4  39.0 - 52.0 %   MCV 87.6  78.0 - 100.0 fL   MCH 31.1  26.0 - 34.0 pg   MCHC 35.4  30.0 - 36.0 g/dL   RDW 12.8  11.5 - 15.5 %   Platelets 170  150 - 400 K/uL   Neutrophils Relative % 80 (*) 43 - 77 %   Neutro Abs 12.0 (*) 1.7 - 7.7 K/uL   Lymphocytes Relative 12  12 - 46 %   Lymphs Abs  1.7  0.7 - 4.0 K/uL   Monocytes Relative 8  3 - 12 %   Monocytes Absolute 1.1 (*) 0.1 - 1.0 K/uL   Eosinophils Relative 0  0 - 5 %   Eosinophils Absolute 0.0  0.0 - 0.7 K/uL   Basophils Relative 0  0 - 1 %   Basophils Absolute 0.0  0.0 - 0.1 K/uL  COMPREHENSIVE METABOLIC PANEL     Status: None   Collection Time    05/26/14 11:50 AM      Result Value Ref Range   Sodium 139  137 - 147 mEq/L   Potassium 3.8  3.7 - 5.3 mEq/L   Chloride 101  96 - 112 mEq/L   CO2 25  19 - 32 mEq/L   Glucose, Bld 97  70 - 99 mg/dL   BUN 14  6 - 23 mg/dL   Creatinine, Ser 0.88  0.50 - 1.35 mg/dL   Calcium 9.5  8.4 - 10.5 mg/dL   Total Protein 7.9  6.0 - 8.3 g/dL   Albumin 4.2  3.5 - 5.2 g/dL   AST 21  0 - 37 U/L   ALT 41  0 - 53 U/L   Alkaline Phosphatase 46  39 - 117 U/L   Total Bilirubin 0.7  0.3 - 1.2 mg/dL   GFR calc non Af Amer >90  >90 mL/min   GFR calc Af Amer >90  >90 mL/min   Comment: (NOTE)     The eGFR has been calculated using the CKD EPI equation.     This calculation has not been validated in all clinical situations.     eGFR's persistently <90 mL/min signify possible Chronic Kidney     Disease.   Anion gap 13  5 - 15  LIPASE, BLOOD     Status: None   Collection Time    05/26/14 11:50 AM      Result Value Ref Range   Lipase 24  11 - 59 U/L  URINALYSIS, ROUTINE W REFLEX MICROSCOPIC     Status: None   Collection Time    05/26/14 11:50 AM      Result Value Ref Range   Color, Urine YELLOW  YELLOW   APPearance CLEAR  CLEAR   Specific Gravity, Urine 1.015  1.005 - 1.030   pH 6.5  5.0 - 8.0   Glucose, UA NEGATIVE  NEGATIVE mg/dL   Hgb urine dipstick NEGATIVE  NEGATIVE   Bilirubin Urine NEGATIVE  NEGATIVE   Ketones, ur NEGATIVE  NEGATIVE mg/dL   Protein, ur NEGATIVE  NEGATIVE mg/dL   Urobilinogen, UA 0.2  0.0 - 1.0 mg/dL   Nitrite NEGATIVE  NEGATIVE   Leukocytes, UA NEGATIVE  NEGATIVE   Comment: MICROSCOPIC NOT DONE ON URINES WITH NEGATIVE PROTEIN, BLOOD, LEUKOCYTES, NITRITE, OR  GLUCOSE <1000 mg/dL.   Ct Abdomen Pelvis W Contrast  05/26/2014   CLINICAL DATA:  Right lower quadrant pain  EXAM: CT ABDOMEN AND PELVIS WITH CONTRAST  TECHNIQUE: Multidetector CT imaging of the abdomen and pelvis was performed using the standard protocol following bolus administration of intravenous contrast.  CONTRAST:  21m OMNIPAQUE IOHEXOL 300 MG/ML SOLN, 10108mOMNIPAQUE IOHEXOL 300 MG/ML SOLN  COMPARISON:  None.  FINDINGS: Lung bases are clear.  No pericardial fluid.  No focal hepatic lesion. The gallbladder, pancreas, spleen, adrenal glands, and kidneys are normal.  The stomach, small bowel and cecum are normal. There is inflammatory stranding surrounding the cecum and a small amount fluid in the right pericolic gutter. The appendix is potentially identified on image 56, series 2. Appendix appears thickened with periappendiceal inflammation. Appendix appears to extend inferior and medial from the cecal tip in expected location.  No evidence of abscess or significant perforation at this time. Cannot exclude a small perforation with the fluid along the pericolic gutter.  Colon and rectosigmoid colon are normal.  Abdominal aorta is normal caliber. No retroperitoneal periportal lymphadenopathy. No free fluid the pelvis. There is a low-density oblong lesion within the prostate gland measuring 29 mm in craniocaudad dimension and 13 x 16 mm in axial dimension. This extends along the course of the prostatic urethra posteriorly.  The no pelvic lymphadenopathy.  No aggressive osseous lesion.  IMPRESSION: 1. Acute appendicitis with significant amount inflammation surrounding cecal tip and appendix. No evidence of abscess. Cannot exclude a small perforation with fluid along the pericolic gutter. 2. 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 Recommend urology consultation. Findings conveyed toBRIAN [name] on 05/26/2014  at14:08.   Electronically Signed   By:  Suzy Bouchard M.D.   On: 05/26/2014 14:08    Review of Systems  Constitutional: Positive for malaise/fatigue.  HENT: Negative.   Eyes: Negative.   Respiratory: Negative.   Cardiovascular: Negative.   Gastrointestinal: Positive for nausea and abdominal pain.  Genitourinary: Negative.   Musculoskeletal: Negative.   Skin: Negative.   Neurological: Negative.   Endo/Heme/Allergies: Negative.     Blood pressure 120/78, pulse 105, temperature 98.6 F (37 C), temperature source Oral, resp. rate 16, height 5' 9"  (1.753 m), weight 86.183 kg (190 lb), SpO2 96.00%. Physical Exam  Vitals reviewed. Constitutional: He is oriented to person, place, and time. He appears well-developed and well-nourished.  HENT:  Head: Normocephalic and atraumatic.  Neck: Normal range of motion. Neck supple.  Cardiovascular: Normal rate, regular rhythm and normal heart sounds.   Respiratory: Effort normal and breath sounds normal.  GI: Soft. He exhibits no distension. There is tenderness.  Tender in the right lower quadrant to palpation. No rigidity noted.  Neurological: He is alert and oriented to person, place, and time.  Skin: Skin is warm and dry.     Assessment/Plan Impression: Acute appendicitis Plan: Patient will be taken to the operating room for laparoscopic appendectomy today. The risks and benefits of the procedure including bleeding, infection, and the possibility of an open procedure were fully explained to the patient, who gave informed consent.  Leandria Thier A 05/26/2014, 3:39 PM

## 2014-05-26 NOTE — ED Notes (Signed)
Requesting more pain medication. States morphine temporarily decreased pain.

## 2014-05-26 NOTE — ED Notes (Signed)
PT c/o right lower abdominal pain x1 day with nausea and one episode of diarrhea this am.

## 2014-05-26 NOTE — Anesthesia Preprocedure Evaluation (Signed)
Anesthesia Evaluation  Patient identified by MRN, date of birth, ID band Patient awake    Reviewed: Allergy & Precautions, H&P , NPO status , Patient's Chart, lab work & pertinent test results  Airway Mallampati: I TM Distance: >3 FB Neck ROM: Full    Dental  (+) Teeth Intact, Dental Advisory Given   Pulmonary    Pulmonary exam normal       Cardiovascular Exercise Tolerance: Good     Neuro/Psych    GI/Hepatic negative GI ROS,   Endo/Other    Renal/GU      Musculoskeletal   Abdominal (+)  Abdomen: tender.    Peds  Hematology   Anesthesia Other Findings   Reproductive/Obstetrics                           Anesthesia Physical Anesthesia Plan  ASA: I and emergent  Anesthesia Plan: General   Post-op Pain Management:    Induction: Intravenous, Rapid sequence and Cricoid pressure planned  Airway Management Planned: Oral ETT  Additional Equipment:   Intra-op Plan:   Post-operative Plan: Extubation in OR  Informed Consent:   Plan Discussed with: Anesthesiologist and Surgeon  Anesthesia Plan Comments:         Anesthesia Quick Evaluation

## 2014-05-26 NOTE — ED Provider Notes (Signed)
CSN: [id number]     Arrival date & time 05/26/14  1101 History   First MD Initiated Contact with Patient 05/26/14 1105     This chart was scribed for Nat Christen, MD by Forrestine Him, ED Scribe. This patient was seen in room [facility] and the patient's care was started 11:12 AM.   Chief Complaint  Patient presents with  . Abdominal Pain   The history is provided by the patient. No language interpreter was used.    HPI Comments: Alan [name] is a 28 y.o. male who presents to the Emergency Department complaining of constant, moderate RLQ abdominal pain onset1 day around lunch time that has progressively worsened. He describes pain as stabbing. He also admits to nausea and decreased appetite. Pt reports 1 loose stool this morning described as "dark". No dysuria or hematuria at this time. He denies any alcohol or illicit drug use. Reports having a normal colonoscopy about 2 months ago with plans to follow up with a CT scan in the future. No history of abdominal surgeries. However, he reports a PSHx of an inguinal hernia repair 15 years ago performed in [place]. He is currently not on any routine medications. He is in trade and works in Therapist, occupational.   He is followed by Dr. Oneida Alar- Gastrointerologist   CT  Blood Fluids labs Past Medical History  Diagnosis Date  . Diverticulitis 2009 [place]    CT POS [facility] INFLAMMATION   Past Surgical History  Procedure Laterality Date  . Shoulder fusion surgery      left  . Hernia repair    . Colonoscopy N/A 02/23/2014    Procedure: COLONOSCOPY;  Surgeon: Danie Binder, MD;  Location[facility];  Service: Endoscopy;  Laterality: N/A;  10:45   Family History  Problem Relation Age of Onset  . Colon polyps Neg Hx   . Colon cancer Neg Hx   . Rectal cancer Neg Hx   . Stomach cancer Neg Hx   . Epilepsy Mother   . Hypertension Father    History  Substance Use Topics  . Smoking status: Never Smoker   . Smokeless tobacco: Not on file   . Alcohol Use: Yes     Comment: Occasionally    Review of Systems  Constitutional: Positive for appetite change. Negative for fever and chills.  Gastrointestinal: Positive for nausea and abdominal pain.  Skin: Negative for rash.  Psychiatric/Behavioral: Negative for confusion.      Allergies  Shellfish allergy and Sulfa antibiotics  Home Medications   Prior to Admission medications   Not on File   Triage Vitals: BP 131/95  Pulse 118  Temp(Src) 98.6 F (37 C) (Oral)  Resp 18  Ht 5\' 9"  (1.753 m)  Wt 190 lb (86.183 kg)  BMI 28.05 kg/m2  SpO2 95%   Physical Exam  Nursing note and vitals reviewed. Constitutional: He is oriented to person, place, and time. He appears well-developed and well-nourished.  HENT:  Head: Normocephalic and atraumatic.  Eyes: Conjunctivae and EOM are normal. Pupils are equal, round, and reactive to light.  Neck: Normal range of motion. Neck supple.  Cardiovascular: Normal rate, regular rhythm and normal heart sounds.   Pulmonary/Chest: Effort normal and breath sounds normal.  Abdominal: Soft. Bowel sounds are normal. There is tenderness.  Minimal RLQ tenderness  Musculoskeletal: Normal range of motion.  Neurological: He is alert and oriented to person, place, and time.  Skin: Skin is warm and dry.  Psychiatric: He has  a normal mood and affect. His behavior is normal.    ED Course  Procedures (including critical care time)  DIAGNOSTIC STUDIES: Oxygen Saturation is 95% on RA, Adequate by my interpretation.    COORDINATION OF CARE: 11:43 AM- Will give fluids, Morphine injection, and Zofran. Will order CT Abdomen Pelvis w/ contrast, CBC, CMP, Lipase, and Urinalysis. Discussed treatment plan with pt at bedside and pt agreed to plan.     Labs Review Labs Reviewed  CBC WITH DIFFERENTIAL - Abnormal; Notable for the following:    WBC 14.9 (*)    Neutrophils Relative % 80 (*)    Neutro Abs 12.0 (*)    Monocytes Absolute 1.1 (*)    All other  components within normal limits  COMPREHENSIVE METABOLIC PANEL  LIPASE, BLOOD  URINALYSIS, ROUTINE W REFLEX MICROSCOPIC  URINE MICROSCOPIC-ADD ON    Imaging Review Ct Abdomen Pelvis W Contrast  05/26/2014   CLINICAL DATA:  Right lower quadrant pain  EXAM: CT ABDOMEN AND PELVIS WITH CONTRAST  TECHNIQUE: Multidetector CT imaging of the abdomen and pelvis was performed using the standard protocol following bolus administration of intravenous contrast.  CONTRAST:  59mL OMNIPAQUE IOHEXOL 300 MG/ML SOLN, 17mL OMNIPAQUE IOHEXOL 300 MG/ML SOLN  COMPARISON:  None.  FINDINGS: Lung bases are clear.  No pericardial fluid.  No focal hepatic lesion. The gallbladder, pancreas, spleen, adrenal glands, and kidneys are normal.  The stomach, small bowel and cecum are normal. There is inflammatory stranding surrounding the cecum and a small amount fluid in the right pericolic gutter. The appendix is potentially identified on image 56, series 2. Appendix appears thickened with periappendiceal inflammation. Appendix appears to extend inferior and medial from the cecal tip in expected location.  No evidence of abscess or significant perforation at this time. Cannot exclude a small perforation with the fluid along the pericolic gutter.  Colon and rectosigmoid colon are normal.  Abdominal aorta is normal caliber. No retroperitoneal periportal lymphadenopathy. No free fluid the pelvis. There is a low-density oblong lesion within the prostate gland measuring 29 mm in craniocaudad dimension and 13 x 16 mm in axial dimension. This extends along the course of the prostatic urethra posteriorly.  The no pelvic lymphadenopathy.  No aggressive osseous lesion.  IMPRESSION: 1. Acute appendicitis with significant amount inflammation surrounding cecal tip and appendix. No evidence of abscess. Cannot exclude a small perforation with fluid along the pericolic gutter. 2. Low-density lesion within the prostate posterior to the urethra could  represent a urethral diverticulum. Recommend urology consultation. Findings conveyed toBRIAN Malcome Ambrocio on 05/26/2014  at14:08.   Electronically Signed   By: Suzy Bouchard M.D.   On: 05/26/2014 14:08     EKG Interpretation None      MDM   Final diagnoses:  Acute appendicitis, unspecified acute appendicitis type    History and physical consistent with acute appendicitis. CT scan confirms same. No acute abdomen. Discussed with Dr. Aviva Signs. IV fluids, pain management, IV Zosyn.  I personally performed the services described in this documentation, which was scribed in my presence. The recorded information has been reviewed and is accurate.    Nat Christen, MD 05/26/14 1438

## 2014-05-26 NOTE — Progress Notes (Signed)
PCMX used for foley insertion due to patient allergy to shellfish

## 2014-05-26 NOTE — ED Notes (Signed)
Dr. Jenkins at bedside. 

## 2014-05-26 NOTE — Anesthesia Postprocedure Evaluation (Signed)
  Anesthesia Post-op Note  Patient: Alan [name]  Procedure(s) Performed: Procedure(s): APPENDECTOMY LAPAROSCOPIC (N/A)  Patient Location: PACU  Anesthesia Type:General  Level of Consciousness: awake and patient cooperative  Airway and Oxygen Therapy: Patient Spontanous Breathing  Post-op Pain: mild  Post-op Assessment: Post-op Vital signs reviewed, Patient's Cardiovascular Status Stable, Respiratory Function Stable, Patent Airway, No signs of Nausea or vomiting and Pain level controlled  Post-op Vital Signs: Reviewed and stable  Last Vitals:  Filed Vitals:   05/26/14 1720  BP: 105/86  Pulse: 136  Temp: 37.8 C  Resp: 16    Complications: No apparent anesthesia complications

## 2014-05-26 NOTE — Transfer of Care (Signed)
Immediate Anesthesia Transfer of Care Note  Patient: Alan [name]  Procedure(s) Performed: Procedure(s): APPENDECTOMY LAPAROSCOPIC (N/A)  Patient Location: PACU  Anesthesia Type:General  Level of Consciousness: sedated and patient cooperative  Airway & Oxygen Therapy: Patient Spontanous Breathing and non-rebreather face mask  Post-op Assessment: Report given to PACU RN, Post -op Vital signs reviewed and stable and Patient moving all extremities  Post vital signs: Reviewed and stable  Complications: No apparent anesthesia complications

## 2014-05-26 NOTE — Discharge Instructions (Signed)
Laparoscopic Appendectomy Care After Refer to this sheet in the next few weeks. These instructions provide you with information on caring for yourself after your procedure. Your caregiver may also give you more specific instructions. Your treatment has been planned according to current medical practices, but problems sometimes occur. Call your caregiver if you have any problems or questions after your procedure. HOME CARE INSTRUCTIONS  Do not drive while taking narcotic pain medicines.  Use stool softener if you become constipated from your pain medicines.  Change your bandages (dressings) as directed.  Keep your wounds clean and dry. You may wash the wounds gently with soap and water. Gently pat the wounds dry with a clean towel.  Do not take baths, swim, or use hot tubs for 10 days, or as instructed by your caregiver.  Only take over-the-counter or prescription medicines for pain, discomfort, or fever as directed by your caregiver.  You may continue your normal diet as directed.  Do not lift more than 10 pounds (4.5 kg) or play contact sports for 3 weeks, or as directed.  Slowly increase your activity after surgery.  Take deep breaths to avoid getting a lung infection (pneumonia). SEEK MEDICAL CARE IF:  You have redness, swelling, or increasing pain in your wounds.  You have pus coming from your wounds.  You have drainage from a wound that lasts longer than 1 day.  You notice a bad smell coming from the wounds or dressing.  Your wound edges break open after stitches (sutures) have been removed.  You notice increasing pain in the shoulders (shoulder strap areas) or near your shoulder blades.  You develop dizzy episodes or fainting while standing.  You develop shortness of breath.  You develop persistent nausea or vomiting.  You cannot control your bowel functions or lose your appetite.  You develop diarrhea. SEEK IMMEDIATE MEDICAL CARE IF:   You have a fever.  You  develop a rash.  You have difficulty breathing or sharp pains in your chest.  You develop any reaction or side effects to medicines given. MAKE SURE YOU:  Understand these instructions.  Will watch your condition.  Will get help right away if you are not doing well or get worse. Document Released: 11/02/2005 Document Revised: 01/25/2012 Document Reviewed: 05/12/2011 [facility] Patient Information 2015 [place], [place]. This information is not intended to replace advice given to you by your health care provider. Make sure you discuss any questions you have with your health care provider.

## 2014-05-26 NOTE — Anesthesia Procedure Notes (Signed)
Procedure Name: Intubation Date/Time: 05/26/2014 4:17 PM Performed by: Vista Deck Pre-anesthesia Checklist: Patient identified, Patient being monitored, Timeout performed, Emergency Drugs available and Suction available Patient Re-evaluated:Patient Re-evaluated prior to inductionOxygen Delivery Method: Circle System Utilized Preoxygenation: Pre-oxygenation with 100% oxygen Intubation Type: IV induction, Rapid sequence and Cricoid Pressure applied Ventilation: Mask ventilation without difficulty Laryngoscope Size: Miller and 2 Grade View: Grade I Tube type: Oral Tube size: 7.0 mm Number of attempts: 1 Airway Equipment and Method: stylet Placement Confirmation: ETT inserted through vocal cords under direct vision,  positive ETCO2 and breath sounds checked- equal and bilateral Secured at: 23 cm Tube secured with: Tape Dental Injury: Teeth and Oropharynx as per pre-operative assessment

## 2014-05-26 NOTE — Op Note (Signed)
Patient:  Alan [name]  DOB:  [birth year]  MRN:  [id number]   Preop Diagnosis:  Acute appendicitis  Postop Diagnosis:  Same  Procedure:  Laparoscopic appendectomy  Surgeon:  Aviva Signs, M.D.  Anes:  General endotracheal  Indications:  Patient is a 28 year old white male who presents with acute appendicitis. The risks and benefits of the procedure including bleeding, infection, and the possibility of an open procedure were fully explained to the patient, who gave informed consent.  Procedure note:  The patient was placed in the supine position. After induction of general endotracheal anesthesia, the abdomen was prepped and draped using usual sterile technique with chlor prep. Surgical site confirmation was performed.  A supraumbilical incision was made down to the fascia. A Veress needle was introduced into the abdominal cavity and confirmation of placement was done using the saline drop test. The abdomen was then insufflated to 16 mm mercury pressure. An 11 mm trocar was introduced into the abdominal cavity under direct visualization without difficulty. The patient was placed in deeper Trendelenburg position and an additional 12 mm trocar was placed the suprapubic region a 5 mm trocar was placed left lower quadrant region. The appendix was visualized and noted to be acutely inflamed. There was no evidence of perforation. The mesoappendix was divided using the harmonic scalpel. A vascular Endo GIA was placed across the base the appendix and fired. The appendix was then removed using an Endo Catch bag. The staple line was inspected and noted to be within normal limits. All fluid and air were then evacuated from the abdominal cavity prior to removal of the trochars.  All loose were irrigated with normal saline. All wounds were checked with 0.5% Sensorcaine. The supraumbilical fascia as well as suprapubic fascia were reapproximated using 0 Vicryl interrupted sutures. All skin incisions were  closed using [name]. Bacitracin ointment and dry sterile dressings were applied.  All tape and needle counts were correct the end of the procedure. Patient was extubated in the operating room and transferred to PACU in stable condition.  Complications:  None  EBL:  Minimal  Specimen:  Appendix

## 2014-05-28 ENCOUNTER — Encounter (HOSPITAL_COMMUNITY): Payer: Self-pay | Admitting: General Surgery

## 2014-07-17 ENCOUNTER — Encounter: Payer: Self-pay | Admitting: Gastroenterology

## 2018-02-16 ENCOUNTER — Ambulatory Visit: Payer: Managed Care, Other (non HMO) | Admitting: Family Medicine

## 2018-02-16 ENCOUNTER — Encounter (INDEPENDENT_AMBULATORY_CARE_PROVIDER_SITE_OTHER): Payer: Self-pay

## 2018-02-16 ENCOUNTER — Encounter: Payer: Self-pay | Admitting: Family Medicine

## 2018-02-16 VITALS — BP 134/80 | HR 72 | Temp 96.7°F | Ht 69.0 in | Wt 208.0 lb

## 2018-02-16 DIAGNOSIS — Z0000 Encounter for general adult medical examination without abnormal findings: Secondary | ICD-10-CM | POA: Diagnosis not present

## 2018-02-16 DIAGNOSIS — Z23 Encounter for immunization: Secondary | ICD-10-CM | POA: Diagnosis not present

## 2018-02-16 NOTE — Progress Notes (Signed)
BP 134/80   Pulse 72   Temp (!) 96.7 F (35.9 C) (Oral)   Ht 5' 9"  (1.753 m)   Wt 208 lb (94.3 kg)   BMI 30.72 kg/m    Subjective:    Patient ID: Alan [name], male    DOB: 09/03/1986, 32 y.o.   MRN: [id number]  HPI: Alan [name] is a 32 y.o. male presenting on 02/16/2018 for Establish Care (would like to have labwork today (ate grits today); mother died of heart attack)   HPI Well adult exam Patient denies any chest pain, shortness of breath, headaches or vision issues, abdominal complaints, diarrhea, nausea, vomiting, or joint issues.  Patient is coming in for well adult exam and physical.  He does not have any current complaints today and denies any lightheadedness or dizziness or chest pain.  He is concerned because he has strong family heart history at a younger age in their 34s for both his mother and grandparents.  Relevant past medical, surgical, family and social history reviewed and updated as indicated. Interim medical history since our last visit reviewed. Allergies and medications reviewed and updated.  Review of Systems  Constitutional: Negative for chills and fever.  HENT: Negative for ear pain and tinnitus.   Eyes: Negative for pain.  Respiratory: Negative for cough, shortness of breath and wheezing.   Cardiovascular: Negative for chest pain, palpitations and leg swelling.  Gastrointestinal: Negative for abdominal pain, blood in stool, constipation and diarrhea.  Genitourinary: Negative for dysuria and hematuria.  Musculoskeletal: Negative for back pain and myalgias.  Skin: Negative for rash.  Neurological: Negative for dizziness, weakness and headaches.  Psychiatric/Behavioral: Negative for suicidal ideas.    Per HPI unless specifically indicated above  Social History   Socioeconomic History  . Marital status: Married    Spouse name: Not on file  . Number of children: Not on file  . Years of education: Not on file  . Highest education level: Not  on file  Occupational History  . Not on file  Social Needs  . Financial resource strain: Not on file  . Food insecurity:    Worry: Not on file    Inability: Not on file  . Transportation needs:    Medical: Not on file    Non-medical: Not on file  Tobacco Use  . Smoking status: Never Smoker  . Smokeless tobacco: Never Used  Substance and Sexual Activity  . Alcohol use: Not Currently    Comment: Occasionally  . Drug use: No  . Sexual activity: Yes    Birth control/protection: IUD  Lifestyle  . Physical activity:    Days per week: Not on file    Minutes per session: Not on file  . Stress: Not on file  Relationships  . Social connections:    Talks on phone: Not on file    Gets together: Not on file    Attends religious service: Not on file    Active member of club or organization: Not on file    Attends meetings of clubs or organizations: Not on file    Relationship status: Not on file  . Intimate partner violence:    Fear of current or ex partner: Not on file    Emotionally abused: Not on file    Physically abused: Not on file    Forced sexual activity: Not on file  Other Topics Concern  . Not on file  Social History Narrative  . Not on file  Past Surgical History:  Procedure Laterality Date  . APPENDECTOMY  2015  . COLONOSCOPY N/A 02/23/2014   Procedure: COLONOSCOPY;  Surgeon: Danie Binder, MD;  Location: [facility];  Service: Endoscopy;  Laterality: N/A;  10:45  . HERNIA REPAIR     inguinal  . LAPAROSCOPIC APPENDECTOMY N/A 05/26/2014   Procedure: APPENDECTOMY LAPAROSCOPIC;  Surgeon: Jamesetta So, MD;  Location: [facility];  Service: General;  Laterality: N/A;  . SHOULDER FUSION SURGERY     left    Family History  Problem Relation Age of Onset  . Epilepsy Mother   . Early death Mother 48  . Depression Mother   . Heart disease Mother   . Heart attack Mother   . Hypertension Father   . Early death Maternal Grandmother 6  . Depression Maternal  Grandmother   . Mental illness Maternal Grandmother   . Early death Paternal Grandfather 15  . Heart disease Paternal Grandfather   . Colon polyps Neg Hx   . Colon cancer Neg Hx   . Rectal cancer Neg Hx   . Stomach cancer Neg Hx     Allergies as of 02/16/2018      Reactions   Shellfish Allergy Hives, Nausea And Vomiting   Sulfa Antibiotics Other (See Comments)   Unknown       Medication List    as of 02/16/2018 11:59 PM   You have not been prescribed any medications.        Objective:    BP 134/80   Pulse 72   Temp (!) 96.7 F (35.9 C) (Oral)   Ht 5' 9"  (1.753 m)   Wt 208 lb (94.3 kg)   BMI 30.72 kg/m   Wt Readings from Last 3 Encounters:  02/16/18 208 lb (94.3 kg)  05/26/14 190 lb (86.2 kg)  02/08/14 195 lb 6.4 oz (88.6 kg)    Physical Exam  Constitutional: He is oriented to person, place, and time. He appears well-developed and well-nourished. No distress.  HENT:  Right Ear: External ear normal.  Left Ear: External ear normal.  Nose: Nose normal.  Mouth/Throat: Oropharynx is clear and moist. No oropharyngeal exudate.  Eyes: Pupils are equal, round, and reactive to light. Conjunctivae and EOM are normal. No scleral icterus.  Neck: Neck supple. No thyromegaly present.  Cardiovascular: Normal rate, regular rhythm, normal heart sounds and intact distal pulses.  No murmur heard. Pulmonary/Chest: Effort normal and breath sounds normal. No respiratory distress. He has no wheezes.  Abdominal: Soft. Bowel sounds are normal. He exhibits no distension. There is no tenderness. There is no rebound and no guarding.  Musculoskeletal: Normal range of motion. He exhibits no edema.  Lymphadenopathy:    He has no cervical adenopathy.  Neurological: He is alert and oriented to person, place, and time. Coordination normal.  Skin: Skin is warm and dry. No rash noted. He is not diaphoretic.  Psychiatric: He has a normal mood and affect. His behavior is normal.  Vitals  reviewed.       Assessment & Plan:   Problem List Items Addressed This Visit    None    Visit Diagnoses    Well adult exam    -  Primary   Relevant Orders   CMP14+EGFR (Completed)   CBC with Differential/Platelet (Completed)   Lipid panel (Completed)       Follow up plan: Return in about 1 year (around 02/17/2019), or if symptoms worsen or fail to improve.  Caryl Pina, MD  [place] 02/20/2018, 9:02 PM

## 2018-02-17 LAB — CBC WITH DIFFERENTIAL/PLATELET
BASOS ABS: 0 10*3/uL (ref 0.0–0.2)
BASOS: 1 %
EOS (ABSOLUTE): 0.3 10*3/uL (ref 0.0–0.4)
Eos: 5 %
Hematocrit: 45.6 % (ref 37.5–51.0)
Hemoglobin: 16 g/dL (ref 13.0–17.7)
IMMATURE GRANS (ABS): 0 10*3/uL (ref 0.0–0.1)
Immature Granulocytes: 0 %
LYMPHS ABS: 2.7 10*3/uL (ref 0.7–3.1)
Lymphs: 37 %
MCH: 30.4 pg (ref 26.6–33.0)
MCHC: 35.1 g/dL (ref 31.5–35.7)
MCV: 87 fL (ref 79–97)
MONOCYTES: 10 %
Monocytes Absolute: 0.8 10*3/uL (ref 0.1–0.9)
NEUTROS ABS: 3.4 10*3/uL (ref 1.4–7.0)
Neutrophils: 47 %
Platelets: 214 10*3/uL (ref 150–379)
RBC: 5.26 x10E6/uL (ref 4.14–5.80)
RDW: 13.2 % (ref 12.3–15.4)
WBC: 7.2 10*3/uL (ref 3.4–10.8)

## 2018-02-17 LAB — LIPID PANEL
Chol/HDL Ratio: 1.7 ratio (ref 0.0–5.0)
Cholesterol, Total: 96 mg/dL — ABNORMAL LOW (ref 100–199)
HDL: 55 mg/dL (ref >39)
LDL CALC: 30 mg/dL (ref 0–99)
Triglycerides: 55 mg/dL (ref 0–149)
VLDL Cholesterol Cal: 11 mg/dL (ref 5–40)

## 2018-02-17 LAB — CMP14+EGFR
A/G RATIO: 1.6 (ref 1.2–2.2)
ALBUMIN: 4.6 g/dL (ref 3.5–5.5)
ALT: 50 IU/L — ABNORMAL HIGH (ref 0–44)
AST: 31 IU/L (ref 0–40)
Alkaline Phosphatase: 47 IU/L (ref 39–117)
BUN / CREAT RATIO: 18 (ref 9–20)
BUN: 16 mg/dL (ref 6–20)
Bilirubin Total: 0.4 mg/dL (ref 0.0–1.2)
CALCIUM: 10 mg/dL (ref 8.7–10.2)
CO2: 24 mmol/L (ref 20–29)
Chloride: 105 mmol/L (ref 96–106)
Creatinine, Ser: 0.88 mg/dL (ref 0.76–1.27)
GFR, EST AFRICAN AMERICAN: 131 mL/min/{1.73_m2} (ref >59)
GFR, EST NON AFRICAN AMERICAN: 114 mL/min/{1.73_m2} (ref >59)
Globulin, Total: 2.8 g/dL (ref 1.5–4.5)
Glucose: 91 mg/dL (ref 65–99)
Potassium: 4.4 mmol/L (ref 3.5–5.2)
Sodium: 143 mmol/L (ref 134–144)
Total Protein: 7.4 g/dL (ref 6.0–8.5)

## 2018-07-20 ENCOUNTER — Encounter: Payer: Self-pay | Admitting: Family Medicine

## 2018-07-20 ENCOUNTER — Ambulatory Visit (INDEPENDENT_AMBULATORY_CARE_PROVIDER_SITE_OTHER): Payer: Managed Care, Other (non HMO) | Admitting: Family Medicine

## 2018-07-20 VITALS — BP 141/93 | HR 114 | Temp 97.7°F | Ht 69.0 in | Wt 194.2 lb

## 2018-07-20 DIAGNOSIS — R030 Elevated blood-pressure reading, without diagnosis of hypertension: Secondary | ICD-10-CM

## 2018-07-20 DIAGNOSIS — R945 Abnormal results of liver function studies: Secondary | ICD-10-CM

## 2018-07-20 DIAGNOSIS — J302 Other seasonal allergic rhinitis: Secondary | ICD-10-CM

## 2018-07-20 DIAGNOSIS — R7989 Other specified abnormal findings of blood chemistry: Secondary | ICD-10-CM

## 2018-07-20 NOTE — Progress Notes (Signed)
BP (!) 141/93   Pulse (!) 114   Temp 97.7 F (36.5 C) (Oral)   Ht _0  (1.753 m)   Wt 194 lb 3.2 oz (88.1 kg)   SpO2 94%   BMI 28.68 kg/m    Subjective:    Patient ID: Alan [name], male    DOB: [birth year], 32 y.o.   MRN: [id number]  HPI: Alan [name] is a 32 y.o. male presenting on 07/20/2018 for Nasal Congestion (x 2 weeks- OTC mucinex. Patient is here to follow up on his elevated liver enzymes.) and Cough   HPI Cough and nasal congestion Patient is coming in with complaints of cough nasal congestion and sinus pressure that has been going off for 2 weeks.  Patient has been using over-the-counter Mucinex and feels like it has been improving but he still having significant cough and is coming in to get it checked out.  He denies any fevers or chills or shortness of breath or wheezing.  Elevated liver function Patient is coming in for recheck of elevated liver function from his last time.  It was mildly elevated and we are just rechecking it to see where it sat.  He denies any abdominal pain or nausea or vomiting.  Elevated blood pressure Patient had elevated blood pressure reading today and we are monitoring.  His blood pressure was 141/93.  He denies any headaches  Relevant past medical, surgical, family and social history reviewed and updated as indicated. Interim medical history since our last visit reviewed. Allergies and medications reviewed and updated.  Review of Systems  Constitutional: Negative for chills and fever.  HENT: Positive for congestion, postnasal drip, rhinorrhea, sinus pressure, sneezing and sore throat. Negative for ear discharge, ear pain and voice change.   Eyes: Negative for pain, discharge, redness and visual disturbance.  Respiratory: Positive for cough. Negative for shortness of breath and wheezing.   Cardiovascular: Negative for chest pain and leg swelling.  Gastrointestinal: Negative for abdominal distention, abdominal pain, blood in stool,  constipation, diarrhea and nausea.  Musculoskeletal: Negative for gait problem.  Skin: Negative for rash.  All other systems reviewed and are negative.   Per HPI unless specifically indicated above   Allergies as of 07/20/2018      Reactions   Shellfish Allergy Hives, Nausea And Vomiting   Sulfa Antibiotics Other (See Comments)   Unknown       Medication List    as of 07/20/2018  8:22 AM   You have not been prescribed any medications.        Objective:    BP (!) 141/93   Pulse (!) 114   Temp 97.7 F (36.5 C) (Oral)   Ht _1  (1.753 m)   Wt 194 lb 3.2 oz (88.1 kg)   SpO2 94%   BMI 28.68 kg/m   Wt Readings from Last 3 Encounters:  07/20/18 194 lb 3.2 oz (88.1 kg)  02/16/18 208 lb (94.3 kg)  05/26/14 190 lb (86.2 kg)    Physical Exam  Constitutional: He is oriented to person, place, and time. He appears well-developed and well-nourished. No distress.  HENT:  Right Ear: Tympanic membrane, external ear and ear canal normal.  Left Ear: Tympanic membrane, external ear and ear canal normal.  Nose: Mucosal edema and rhinorrhea present. No sinus tenderness. No epistaxis. Right sinus exhibits no maxillary sinus tenderness and no frontal sinus tenderness. Left sinus exhibits no maxillary sinus tenderness and no frontal sinus tenderness.  Mouth/Throat: Uvula is  midline and mucous membranes are normal. Posterior oropharyngeal edema present. No oropharyngeal exudate, posterior oropharyngeal erythema or tonsillar abscesses.  Eyes: Conjunctivae are normal. No scleral icterus.  Neck: Neck supple. No thyromegaly present.  Cardiovascular: Normal rate, regular rhythm, normal heart sounds and intact distal pulses.  No murmur heard. Pulmonary/Chest: Effort normal and breath sounds normal. No respiratory distress. He has no wheezes. He has no rales.  Abdominal: Soft. Bowel sounds are normal. He exhibits no distension and no mass. There is no tenderness. There is no guarding.    Musculoskeletal: Normal range of motion. He exhibits no edema.  Lymphadenopathy:    He has no cervical adenopathy.  Neurological: He is alert and oriented to person, place, and time. Coordination normal.  Skin: Skin is warm and dry. No rash noted. He is not diaphoretic.  Psychiatric: He has a normal mood and affect. His behavior is normal.  Nursing note and vitals reviewed.       Assessment & Plan:   Problem List Items Addressed This Visit    None    Visit Diagnoses    Elevated liver function tests    -  Primary   Relevant Orders   CMP14+EGFR (Completed)   Hepatitis panel, acute (Completed)   Elevated blood pressure reading       Recommended diet and exercise and talked about DASH diet   Seasonal allergic rhinitis, unspecified trigger       Recommended Flonase and an allergy pill and call back if he is not improving.       Follow up plan: Return in about 1 year (around 07/21/2019), or if symptoms worsen or fail to improve.  Counseling provided for all of the vaccine components Orders Placed This Encounter  Procedures  . CMP14+EGFR  . Hepatitis panel, acute    Caryl Pina, MD [facility] 07/20/2018, 8:22 AM

## 2018-07-21 LAB — CMP14+EGFR
A/G RATIO: 1.5 (ref 1.2–2.2)
ALT: 30 IU/L (ref 0–44)
AST: 23 IU/L (ref 0–40)
Albumin: 4.6 g/dL (ref 3.5–5.5)
Alkaline Phosphatase: 50 IU/L (ref 39–117)
BUN/Creatinine Ratio: 13 (ref 9–20)
BUN: 13 mg/dL (ref 6–20)
Bilirubin Total: 0.5 mg/dL (ref 0.0–1.2)
CO2: 22 mmol/L (ref 20–29)
CREATININE: 0.99 mg/dL (ref 0.76–1.27)
Calcium: 9.2 mg/dL (ref 8.7–10.2)
Chloride: 101 mmol/L (ref 96–106)
GFR, EST AFRICAN AMERICAN: 115 mL/min/{1.73_m2} (ref >59)
GFR, EST NON AFRICAN AMERICAN: 100 mL/min/{1.73_m2} (ref >59)
Globulin, Total: 3.1 g/dL (ref 1.5–4.5)
Glucose: 82 mg/dL (ref 65–99)
POTASSIUM: 4.1 mmol/L (ref 3.5–5.2)
Sodium: 142 mmol/L (ref 134–144)
TOTAL PROTEIN: 7.7 g/dL (ref 6.0–8.5)

## 2018-07-21 LAB — HEPATITIS PANEL, ACUTE
HEP B S AG: NEGATIVE
Hep A IgM: NEGATIVE
Hep B C IgM: NEGATIVE
Hep C Virus Ab: <0.1 s/co ratio (ref 0.0–0.9)

## 2022-11-11 ENCOUNTER — Ambulatory Visit: Payer: Managed Care, Other (non HMO) | Admitting: Family Medicine

## 2024-01-21 ENCOUNTER — Encounter: Payer: Self-pay | Admitting: *Deleted
